# Patient Record
Sex: Male | Born: 1950 | Race: White | Hispanic: No | Marital: Married | Smoking: Former smoker
Health system: Southern US, Community
[De-identification: ages and names within clinical notes are randomized; demographics above are authoritative.]

## PROBLEM LIST (undated history)

## (undated) DIAGNOSIS — I10 Essential (primary) hypertension: Secondary | ICD-10-CM

## (undated) DIAGNOSIS — E785 Hyperlipidemia, unspecified: Secondary | ICD-10-CM

## (undated) DIAGNOSIS — I739 Peripheral vascular disease, unspecified: Secondary | ICD-10-CM

## (undated) DIAGNOSIS — M1990 Unspecified osteoarthritis, unspecified site: Secondary | ICD-10-CM

## (undated) DIAGNOSIS — I6529 Occlusion and stenosis of unspecified carotid artery: Secondary | ICD-10-CM

## (undated) DIAGNOSIS — E1159 Type 2 diabetes mellitus with other circulatory complications: Principal | ICD-10-CM

## (undated) DIAGNOSIS — I639 Cerebral infarction, unspecified: Secondary | ICD-10-CM

## (undated) DIAGNOSIS — I4891 Unspecified atrial fibrillation: Secondary | ICD-10-CM

## (undated) DIAGNOSIS — M069 Rheumatoid arthritis, unspecified: Secondary | ICD-10-CM

## (undated) DIAGNOSIS — I2510 Atherosclerotic heart disease of native coronary artery without angina pectoris: Secondary | ICD-10-CM

## (undated) HISTORY — DX: Essential (primary) hypertension: I10

## (undated) HISTORY — DX: Hyperlipidemia, unspecified: E78.5

## (undated) HISTORY — DX: Rheumatoid arthritis, unspecified: M06.9

## (undated) HISTORY — DX: Unspecified osteoarthritis, unspecified site: M19.90

## (undated) HISTORY — DX: Occlusion and stenosis of unspecified carotid artery: I65.29

## (undated) HISTORY — DX: Cerebral infarction, unspecified: I63.9

## (undated) HISTORY — DX: Type 2 diabetes mellitus with other circulatory complications: E11.59

## (undated) HISTORY — DX: Unspecified atrial fibrillation: I48.91

## (undated) HISTORY — DX: Peripheral vascular disease, unspecified: I73.9

## (undated) HISTORY — PX: ANKLE SURGERY: SHX546

## (undated) HISTORY — PX: HAND SURGERY: SHX662

## (undated) HISTORY — PX: KNEE SURGERY: SHX244

---

## 2001-06-11 ENCOUNTER — Encounter: Payer: Self-pay | Admitting: Emergency Medicine

## 2001-06-11 ENCOUNTER — Ambulatory Visit (HOSPITAL_COMMUNITY): Admission: RE | Admit: 2001-06-11 | Discharge: 2001-06-11 | Payer: Self-pay | Admitting: Emergency Medicine

## 2002-06-03 ENCOUNTER — Encounter: Admission: RE | Admit: 2002-06-03 | Discharge: 2002-06-03 | Payer: Self-pay | Admitting: Emergency Medicine

## 2002-06-03 ENCOUNTER — Encounter: Payer: Self-pay | Admitting: Emergency Medicine

## 2002-06-08 ENCOUNTER — Encounter: Payer: Self-pay | Admitting: Emergency Medicine

## 2002-06-08 ENCOUNTER — Ambulatory Visit (HOSPITAL_COMMUNITY): Admission: RE | Admit: 2002-06-08 | Discharge: 2002-06-08 | Payer: Self-pay | Admitting: Emergency Medicine

## 2002-11-15 ENCOUNTER — Ambulatory Visit (HOSPITAL_BASED_OUTPATIENT_CLINIC_OR_DEPARTMENT_OTHER): Admission: RE | Admit: 2002-11-15 | Discharge: 2002-11-15 | Payer: Self-pay | Admitting: Orthopedic Surgery

## 2003-05-31 ENCOUNTER — Encounter: Admission: RE | Admit: 2003-05-31 | Discharge: 2003-05-31 | Payer: Self-pay | Admitting: Emergency Medicine

## 2003-06-20 ENCOUNTER — Encounter: Admission: RE | Admit: 2003-06-20 | Discharge: 2003-06-20 | Payer: Self-pay | Admitting: Emergency Medicine

## 2003-10-13 ENCOUNTER — Encounter: Admission: RE | Admit: 2003-10-13 | Discharge: 2003-10-13 | Payer: Self-pay | Admitting: Emergency Medicine

## 2003-12-27 ENCOUNTER — Encounter: Admission: RE | Admit: 2003-12-27 | Discharge: 2003-12-27 | Payer: Self-pay | Admitting: Emergency Medicine

## 2004-05-09 ENCOUNTER — Encounter: Admission: RE | Admit: 2004-05-09 | Discharge: 2004-05-09 | Payer: Self-pay | Admitting: Emergency Medicine

## 2004-07-29 DIAGNOSIS — I639 Cerebral infarction, unspecified: Secondary | ICD-10-CM

## 2004-07-29 HISTORY — DX: Cerebral infarction, unspecified: I63.9

## 2004-10-31 ENCOUNTER — Encounter: Admission: RE | Admit: 2004-10-31 | Discharge: 2004-10-31 | Payer: Self-pay | Admitting: Emergency Medicine

## 2004-11-12 ENCOUNTER — Ambulatory Visit (HOSPITAL_COMMUNITY): Admission: RE | Admit: 2004-11-12 | Discharge: 2004-11-12 | Payer: Self-pay | Admitting: Orthopedic Surgery

## 2004-11-12 ENCOUNTER — Ambulatory Visit (HOSPITAL_BASED_OUTPATIENT_CLINIC_OR_DEPARTMENT_OTHER): Admission: RE | Admit: 2004-11-12 | Discharge: 2004-11-12 | Payer: Self-pay | Admitting: Orthopedic Surgery

## 2004-11-16 ENCOUNTER — Emergency Department (HOSPITAL_COMMUNITY): Admission: EM | Admit: 2004-11-16 | Discharge: 2004-11-17 | Payer: Self-pay | Admitting: Emergency Medicine

## 2005-03-25 ENCOUNTER — Encounter: Admission: RE | Admit: 2005-03-25 | Discharge: 2005-03-25 | Payer: Self-pay | Admitting: Emergency Medicine

## 2005-05-06 ENCOUNTER — Encounter: Admission: RE | Admit: 2005-05-06 | Discharge: 2005-05-06 | Payer: Self-pay | Admitting: Emergency Medicine

## 2005-05-10 ENCOUNTER — Encounter (INDEPENDENT_AMBULATORY_CARE_PROVIDER_SITE_OTHER): Payer: Self-pay | Admitting: *Deleted

## 2005-05-10 ENCOUNTER — Ambulatory Visit (HOSPITAL_COMMUNITY): Admission: RE | Admit: 2005-05-10 | Discharge: 2005-05-11 | Payer: Self-pay | Admitting: Vascular Surgery

## 2005-05-10 HISTORY — PX: ANGIOPLASTY: SHX39

## 2005-05-10 HISTORY — PX: CAROTID ENDARTERECTOMY: SUR193

## 2005-06-11 ENCOUNTER — Encounter: Admission: RE | Admit: 2005-06-11 | Discharge: 2005-09-09 | Payer: Self-pay | Admitting: Emergency Medicine

## 2006-03-07 ENCOUNTER — Encounter: Admission: RE | Admit: 2006-03-07 | Discharge: 2006-03-07 | Payer: Self-pay | Admitting: Emergency Medicine

## 2006-09-11 ENCOUNTER — Encounter: Admission: RE | Admit: 2006-09-11 | Discharge: 2006-09-11 | Payer: Self-pay | Admitting: Emergency Medicine

## 2006-12-15 ENCOUNTER — Ambulatory Visit: Payer: Self-pay | Admitting: Vascular Surgery

## 2007-06-12 ENCOUNTER — Ambulatory Visit: Payer: Self-pay | Admitting: Vascular Surgery

## 2007-06-18 ENCOUNTER — Encounter: Payer: Self-pay | Admitting: Vascular Surgery

## 2007-06-18 ENCOUNTER — Observation Stay (HOSPITAL_COMMUNITY): Admission: RE | Admit: 2007-06-18 | Discharge: 2007-06-19 | Payer: Self-pay | Admitting: Vascular Surgery

## 2007-06-18 ENCOUNTER — Ambulatory Visit: Payer: Self-pay | Admitting: Vascular Surgery

## 2007-07-03 ENCOUNTER — Ambulatory Visit: Payer: Self-pay | Admitting: Vascular Surgery

## 2007-07-30 HISTORY — PX: CAROTID ENDARTERECTOMY: SUR193

## 2008-01-08 ENCOUNTER — Ambulatory Visit: Payer: Self-pay | Admitting: Vascular Surgery

## 2008-07-06 ENCOUNTER — Ambulatory Visit: Payer: Self-pay | Admitting: Vascular Surgery

## 2008-10-24 ENCOUNTER — Ambulatory Visit: Payer: Self-pay | Admitting: Vascular Surgery

## 2009-10-13 ENCOUNTER — Ambulatory Visit: Payer: Self-pay | Admitting: Vascular Surgery

## 2010-01-24 ENCOUNTER — Ambulatory Visit: Payer: Self-pay | Admitting: Vascular Surgery

## 2010-02-06 ENCOUNTER — Ambulatory Visit: Payer: Self-pay | Admitting: Surgery

## 2010-02-06 ENCOUNTER — Ambulatory Visit (HOSPITAL_COMMUNITY): Admission: RE | Admit: 2010-02-06 | Discharge: 2010-02-06 | Payer: Self-pay | Admitting: Surgery

## 2010-02-06 HISTORY — PX: CARDIAC CATHETERIZATION: SHX172

## 2010-02-06 HISTORY — PX: CAROTID STENT: SHX1301

## 2010-03-13 ENCOUNTER — Ambulatory Visit: Payer: Self-pay | Admitting: Vascular Surgery

## 2010-08-18 ENCOUNTER — Encounter: Payer: Self-pay | Admitting: Emergency Medicine

## 2010-10-14 LAB — POCT I-STAT, CHEM 8
BUN: 15 mg/dL (ref 6–23)
Calcium, Ion: 1.16 mmol/L (ref 1.12–1.32)
Chloride: 105 mEq/L (ref 96–112)
Creatinine, Ser: 0.7 mg/dL (ref 0.4–1.5)
Glucose, Bld: 133 mg/dL — ABNORMAL HIGH (ref 70–99)
HCT: 40 % (ref 39.0–52.0)
Hemoglobin: 13.6 g/dL (ref 13.0–17.0)
Potassium: 4 mEq/L (ref 3.5–5.1)
Sodium: 142 mEq/L (ref 135–145)
TCO2: 25 mmol/L (ref 0–100)

## 2010-10-14 LAB — GLUCOSE, CAPILLARY
Glucose-Capillary: 140 mg/dL — ABNORMAL HIGH (ref 70–99)
Glucose-Capillary: 163 mg/dL — ABNORMAL HIGH (ref 70–99)

## 2010-12-11 NOTE — Assessment & Plan Note (Signed)
OFFICE VISIT   Colin Parks, Colin Parks  DOB:  [birth year]                                       03/13/2010  [id number]#:[id number]   Patient presents today for follow-up of his recent right subclavian  angioplasty and stenting by Dr. Myra Gianotti on 02/06/2010.  He has done well  since the procedure and reports that he has had a significant  improvement in his dizziness.  He has had 1 episode of dizziness on  looking up over his head with his arms raised but otherwise no  dizziness.   His main disability before had been with driving.  He reports no  dizziness related to this.   PHYSICAL EXAMINATION:  He does have 2+ radial pulses bilaterally.  His  blood pressure is 150 systolic on the right, 146 systolic on the left.  He has triphasic waveforms throughout his axillary and brachial artery  on the right.   I have reviewed the actual films with patient, answering his questions  regarding the technique of angioplasty.  He did have the initial stent  migrated forward and was deployed in the more distal subclavian artery  and then treatment of the focal lesion with a second stent.  He has had  an excellent early response.  We will plan on seeing him again in 1 year  with repeat carotid duplex.     Larina Earthly, M.D.  Electronically Signed   TFE/MEDQ  D:  03/13/2010  T:  03/13/2010  Job:  [id number]   cc:   Jorge Ny, MD

## 2010-12-11 NOTE — Procedures (Signed)
CAROTID DUPLEX EXAM   INDICATION:  Followup bilateral carotid endarterectomies.   HISTORY:  Diabetes:  Yes.  Cardiac:  No.  Hypertension:  Yes.  Smoking:  No.  Previous Surgery:  Right carotid endarterectomy on 06/18/2007, left  carotid endarterectomy in 2006.  CV History:  No.  Amaurosis Fugax No, Paresthesias No, Hemiparesis No                                       RIGHT             LEFT  Brachial systolic pressure:         146               148  Brachial Doppler waveforms:         Normal            Normal  Vertebral direction of flow:        Antegrade         Antegrade  DUPLEX VELOCITIES (cm/sec)  CCA peak systolic                   93                78  ECA peak systolic                   338               130  ICA peak systolic                   57                89  ICA end diastolic                   14                16  PLAQUE MORPHOLOGY:                  Heterogenous      None  PLAQUE AMOUNT:                      Moderate          None  PLAQUE LOCATION:                    ECA               None   IMPRESSION:  1. Patent bilateral carotid endarterectomy sites with no evidence of      stenosis.  2. Intimal thickening noted in the bilateral distal common carotid      arteries.  3. Elevated velocity of 316 cm/second noted in the right subclavian      artery which is suggestive of a significant stenosis.   ___________________________________________  Larina Earthly, M.D.   CH/MEDQ  D:  01/08/2008  T:  01/08/2008  Job:  [id number]

## 2010-12-11 NOTE — Procedures (Signed)
CAROTID DUPLEX EXAM   INDICATION:  Follow-up evaluation of known carotid artery disease.   HISTORY:  Diabetes:  Yes.  Cardiac:  No.  Hypertension:  Yes.  Smoking:  No.  Previous Surgery:  Right carotid endarterectomy on 06/18/2007.  Left  carotid endarterectomy in 2006.  CV History:  Patient reports no cerebrovascular symptoms at this time.  Previous duplex on 01/08/2008 revealed bilateral ICA stenosis, status  post endarterectomy.  Amaurosis Fugax No, Paresthesias No, Hemiparesis No.                                       RIGHT             LEFT  Brachial systolic pressure:         126               168  Brachial Doppler waveforms:         Monophasic        Triphasic  Vertebral direction of flow:        Atypical antegrade                  Antegrade  DUPLEX VELOCITIES (cm/sec)  CCA peak systolic                   86                120  ECA peak systolic                   302               116  ICA peak systolic                   57                69  ICA end diastolic                   21                20  PLAQUE MORPHOLOGY:                  Mixed             None  PLAQUE AMOUNT:                      Moderate-to-severe                  None  PLAQUE LOCATION:                    Proximal subclavian artery          None   IMPRESSION:  1. Right subclavian peak systolic velocity 305 cm/s.  2. No internal carotid artery stenosis, status post endarterectomy      bilaterally.  3. Right external carotid artery stenosis.  4. 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   ___________________________________________  Larina Earthly, M.D.   MC/MEDQ  D:  07/06/2008  T:  07/06/2008  Job:  [id number]

## 2010-12-11 NOTE — Procedures (Signed)
CAROTID DUPLEX EXAM   INDICATION:  Dizziness.   HISTORY:  Diabetes:  Yes.  Cardiac:  No.  Hypertension:  Yes.  Smoking:  No.  Previous Surgery:  Left carotid endarterectomy in 2006, right carotid  endarterectomy on 06/18/07.  CV History:  Yes, in 2006.  Amaurosis Fugax No, Paresthesias No, Hemiparesis No.                                       RIGHT             LEFT  Brachial systolic pressure:         106               162  Brachial Doppler waveforms:         Monophasic        Triphasic  Vertebral direction of flow:        Atypical antegrade                  Antegrade  DUPLEX VELOCITIES (cm/sec)  CCA peak systolic                   129               138  ECA peak systolic                   383               172  ICA peak systolic                   85                75  ICA end diastolic                   36                31  PLAQUE MORPHOLOGY:                  None              None  PLAQUE AMOUNT:                      None              None  PLAQUE LOCATION:                    None              None   IMPRESSION:  1. Patent bilateral internal carotid arteries with no evidence of      restenosis.  2. A known increased velocity in the right subclavian artery was also      noted.    ___________________________________________  Larina Earthly, M.D.   AC/MEDQ  D:  10/24/2008  T:  10/24/2008  Job:  [id number]

## 2010-12-11 NOTE — Procedures (Signed)
CAROTID DUPLEX EXAM   INDICATION:  Follow up of known carotid artery disease.  The patient is  experiencing floaters in left eye.  He has an appointment with his eye  doctor on 07/08/2007.   HISTORY:  Diabetes:  Yes.  Cardiac:  No.  Hypertension:  Yes.  Smoking:  No.  Previous Surgery:  Left CEA in 2006.  CV History:  Amaurosis Fugax no, Paresthesias no, Hemiparesis no.                                       RIGHT             LEFT  Brachial systolic pressure:         145               120  Brachial Doppler waveforms:         Within normal limits                Within normal limits  Vertebral direction of flow:        antegrade         antegrade  DUPLEX VELOCITIES (cm/sec)  CCA peak systolic                   73                112  ECA peak systolic                   199               104  ICA peak systolic                   >400              92  ICA end diastolic                   207               24  PLAQUE MORPHOLOGY:                  heterogenous      N/A  PLAQUE AMOUNT:                      severe            N/A  PLAQUE LOCATION:                    Bifurcation, ICA. N/A   IMPRESSION:  1. Right 80 to 99 ICA stenosis (at higher end of range), which would  demonstrate progress in disease since previous exam.  2. No recurrent stenosis, status post left CEA.  3. Right ECA with mild stenosis.  4. Evidence of right SCA occlusive disease with biphasic waveforms,  however, vertebral direction of  flow remains antegrade.   ___________________________________________  Larina Earthly, M.D.   PB/MEDQ  D:  06/12/2007  T:  06/13/2007  Job:  [id number]

## 2010-12-11 NOTE — Op Note (Signed)
NAME:  Colin Parks, Colin Parks NO.:  [id number]   MEDICAL RECORD NO.:  [id number]          PATIENT TYPE:  INP   LOCATION:  2899                         FACILITY:  [facility]   PHYSICIAN:  Larina Earthly, M.D.    DATE OF BIRTH:  1950-10-11   DATE OF PROCEDURE:  06/18/2007  DATE OF DISCHARGE:                               OPERATIVE REPORT   PREOPERATIVE DIAGNOSIS:  Severe asymptomatic right internal carotid  stenosis.   POSTOPERATIVE DIAGNOSIS:  Severe asymptomatic right internal carotid  stenosis.   PROCEDURE:  Right carotid endarterectomy and Dacron patch angioplasty.   SURGEON:  Larina Earthly, M.D.   ASSISTANT:  Jerold Coombe, P.A.-C.   ANESTHESIA:  General endotracheal anesthesia.   COMPLICATIONS:  None.   DISPOSITION:  To the recovery room stable.   PROCEDURE IN DETAIL:  The patient was taken to the operating room and  placed in the supine position where the area of the right neck was  prepped and draped in the usual sterile fashion.  An incision was made  anterior to the sternocleidomastoid and carried down through the  platysma with electrocautery.  The sternocleidomastoid was reflected  posteriorly and the carotid sheath was opened.  The facial vein was  ligated with 2-0 silk ties and divided.  The sternocleidomastoid was  reflected posteriorly and the carotid sheath was opened.  The vagus and  hypoglossal nerves were identified and preserved.  The common carotid  artery was circled with an umbilical tape and Rumel tourniquet.  The  dissection was carried onto the bifurcation. The external carotid was  encircled with a blue vessel loop.  The internal carotid was encircled  with an umbilical tape and Rumel tourniquet. The superior thyroid artery  was encircled with a 2-0 silk Potts tie.  The patient was given 8000  units of intravenous heparin. After adequate circulation time, the  internal, external, and common carotid arteries were occluded.  The  common  carotid artery was opened with an 11 blade and extended  longitudinally with Potts scissors through the plaque onto the internal  carotid artery.  The 10 shunt was passed up the internal carotid artery,  allowed to back bleed, and down the common carotid where it was secured  with Rumel tourniquets.  An endarterectomy was begun on the common  carotid artery and the plaque was divided proximally with Potts  scissors.  The endarterectomy was continued onto the bifurcation.  The  external carotid was endarterectomized with eversion technique and the  internal carotid was endarterectomized in an open fashion.  The  remaining atheromatous debris was removed from the endarterectomy plane.  A Finesse Hemashield Dacron patch was brought onto the field and was  sewn as a patch angioplasty with a running 6-0 Prolene suture.  Prior to  completion of the patch closure, the shunt was removed and the usual  flushing maneuvers were undertaken.  The anastomosis was completed.  Flow was restored first to the external and then the internal carotid  arteries.  Excellent flow characteristics were noted with handheld  Doppler in the internal and  external carotid arteries.  The patient was  given 50 mg of protamine to reverse the heparin.  The wounds were  irrigated with saline.  Hemostasis was with electrocautery.  The wounds  were closed with several 3-0 Vicryl sutures to reapproximate the  sternocleidomastoid over the carotid sheath.  Next, the platysma was  closed with a running 3-0 Vicryl suture and finally, the skin was closed  with a 4-0 subcuticular Vicryl stitch.  A sterile dressing was applied.  The patient was awakened neurologically intact in the operating room and  transferred to the recovery room in stable condition.      Larina Earthly, M.D.  Electronically Signed     TFE/MEDQ  D:  06/18/2007  T:  06/18/2007  Job:  [id number]

## 2010-12-11 NOTE — H&P (Signed)
HISTORY AND PHYSICAL EXAMINATION   June 12, 2007   Re:  HARTMAN, MINAHAN                DOB:  May 29, 1951   CHIEF COMPLAINT:  Asymptomatic right internal carotid artery stenosis.   HISTORY OF PRESENT ILLNESS:  Patient is a 60 year old white male status  post left carotid endarterectomy for symptomatic left carotid stenosis  in 2006.  He had a known moderate right internal carotid artery stenosis  and has undergone serial duplex followups to rule out any progression.  He presents today for followup of his asymptomatic disease.  He  specifically denies any new episodes of amaurosis fugax, transient  ischemic attack, or stroke.  He has been stable from an overall medical  standpoint with no new cardiac difficulty or other major medical  difficulties.   PAST MEDICAL HISTORY:  Significant for non-insulin-dependent diabetes  and hypertension.   FAMILY HISTORY:  Significant for premature atherosclerotic disease in  his mother.   SOCIAL HISTORY:  Married with two children.  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  Does  not smoke and has occasional alcohol consumption.   REVIEW OF SYSTEMS:  Positive only for his neurologic deficits and also a  history of arthritis and joint pain.   ALLERGIES:  CODEINE.   MEDICATIONS:  1. Metformin 500 mg daily.  2. Altace 5 mg daily.  3. Vytorin daily.  4. Singulair daily.  5. He is on aspirin and Plavix.   PHYSICAL EXAMINATION:  A well-developed and well-nourished white male  appearing stated age of 60.  Blood pressure is 142/93, pulse 78,  respirations 18.  He is 98% saturated on room air.  His carotid arteries  reveals a well-healed left carotid incision.  He does have a right  carotid bruit.  He underwent a repeat carotid duplex in our office  today, and this reveals progression to critical stenosis in his right  internal carotid system with no evidence of restenosis in his left  carotid artery.   IMPRESSION:  Severe asymptomatic right  internal carotid artery stenosis.   PLAN:  Patient will be admitted for right carotid endarterectomy and  Dacron patch angioplasty on November 20.  The procedure, including a 1-  10% risk of stroke and low risk for cranial nerve injury were discussed  with the patient, who understands.  Also discussed the alternative of  carotid stenting and recommended against this to his 60 age and  higher stroke rate with stenting.   Larina Earthly, M.D.  Electronically Signed   TFE/MEDQ  D:  06/12/2007  T:  06/15/2007  Job:  [id number]   cc:   Reuben Likes, M.D.

## 2010-12-11 NOTE — Assessment & Plan Note (Signed)
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  He is status post right carotid  endarterectomy 6 months ago and status post left carotid endarterectomy  in 2006.  He has had no neurologic deficits.  He does report some  incisional soreness in the right mid carotid incision.  He reports it  causes some discomfort when he shaves.  He has had no new cardiac  difficulties.  He continues to be a nonsmoker.  He is treated for  diabetes and hypertension.   PHYSICAL EXAMINATION:  General:  A well-developed, well-nourished white  male appearing his stated age of 45.  Vital signs:  Blood pressure is  154/99 on the right, 144/90 on the left, pulse 99, respirations 18.  He  has 2+ left radial pulse, 1+ right radial pulse.  Carotid incisions are  well-healed bilaterally.  He does not have any carotid bruits  bilaterally.  He is grossly intact neurologically.   He underwent carotid duplex followup today in our office and this  reveals a widely patent endarterectomy bilaterally with no evidence of  stenosis.  He does have elevated velocities in his external carotid on  the right which is of no consequence.  He also had an incidental finding  of right subclavian artery stenosis which is asymptomatic.  I discussed  this at length with the patient.  We will continue to follow him in our  noninvasive vascular lab to rule out any progression in the future.  He  knows to notify us should he develop any neurologic deficits.   Larina Earthly, M.D.  Electronically Signed   TFE/MEDQ  D:  01/08/2008  T:  01/08/2008  Job:  [id number]   cc:   Reuben Likes, M.D.

## 2010-12-11 NOTE — Discharge Summary (Signed)
NAME:  Colin Parks, Colin Parks NO.:  [id number]   MEDICAL RECORD NO.:  [id number]          PATIENT TYPE:  INP   LOCATION:  3310                         FACILITY:  [facility]   PHYSICIAN:  Larina Earthly, M.D.    DATE OF BIRTH:  [birth year]   DATE OF ADMISSION:  06/18/2007  DATE OF DISCHARGE:  06/19/2007                               DISCHARGE SUMMARY   ADMISSION DIAGNOSIS:  Severe right internal carotid stenosis,  asymptomatic.   DIAGNOSES AT DISCHARGE AND SECONDARY:  1. Asymptomatic right internal carotid stenosis.  2. Non-insulin-dependent diabetes.  3. Hypertension.   CONSULTS:  None.   PROCEDURES:  Right carotid endarterectomy with Dacron patch.   BRIEF HISTORY:  The patient is a 60 year old white male status post left  carotid endarterectomy for symptomatic left carotid stenosis in 2006.  He had known moderate right internal carotid artery stenosis and has  undergone serial duplex followups to rule out any progression.  He  presented on June 12, 2007 in the office for a followup of an  asymptomatic disease.  He specifically denied any new episodes of  amaurosis fugax, transient ischemic attack, or stroke.  The patient has  been stable from an overall medical standpoint with no new cardiac  difficulty or other major medical difficulties.   On physical examination in the office, the patient was a well-developed  and well-nourished white male appearing stated age of 60.  His vitals  were stable, and he was afebrile.  The patient had a right carotid  bruit.   The patient underwent a right carotid duplex in the office on June 12, 2007 and revealed progression to critical stenosis in his right  internal carotid system with no evidence of restenosis in his left  carotid artery.   Upon impression of a severe asymptomatic right internal carotid arterial  stenosis, plans were made for the patient to be admitted for a right  carotid endarterectomy and Dacron patch  angioplasty on June 18, 2007.  This procedure included a 1% to 10% risk of stroke and low risk  for cranial nerve injury that were discussed with the patient, and the  patient understood.  Also discussed with patient was alternative carotid  stenting, even though we recommended against this due to his young age  and higher stroke rate with stenting.  The patient agreed to have the  procedure of the right carotid endarterectomy with Dacron patch on  June 18, 2007.  Procedure was completed with no complications.   HOSPITAL COURSE:  Procedure for a right carotid endarterectomy with  Dacron patch was completed on June 18, 2007 by Dr. Arbie Cookey.  After  completion of procedure, the patient was in the PACU unit in good  condition.  Later, the patient was transferred to 3300 where patient  continued to remain stable.  On June 19, 2007, postop day #1,  patient was afebrile.  The patient's blood pressure is 132/59, heart  rate 71, temperature 99.9, and O2 at 96.   LABS:  White blood count 11.8, hemoglobin 11.9, hematocrit 34.9,  platelets 271, red blood  cell 4.16.  Sodium 139, potassium 3.7, chloride  104, bicarb 26, glucose 137, BUN 11, creatinine 0.85, calcium 8.5.   On physical examination, the patient is alert and oriented with no  complaints.  The patient's incision looks good.  No hematoma.  The  patient is eating and tolerating food well.  Plan is for patient to go  home once he is ambulated in the morning.  The patient is neurologically  intact and remains stable at this time.  There is no signs of deviation  of the tongue.  At this time, we feel as long as the patient is able to  ambulate in the morning and continues to tolerate and swallow food  without any difficulty, will be prepared to discharge to home today.   DISCHARGE PLANS:   MEDICATIONS:  1. Metformin 500 mg a day.  2. Altace 5 mg a day.  3. Vytorin daily.  4. Singulair daily.  5. Tylenol.  6. Aspirin 81  mg.  7. __________ .  8. Multivitamin.  9. Meloxicam.  10.Plavix 75 mg.  11.Darvocet N-100 one to two tablets every 6 hours as needed for pain.   FOLLOWUP CARE AND INSTRUCTIONS FOR PATIENT UPON DISCHARGING:  The  patient was instructed to continue to maintain a low-sodium heart  healthy diet along with monitoring his diabetic and eating accordingly  for diabetic lifestyle.  The patient was instructed that he could  increase activity slowly, may walk with assistance, walk up steps.  The  patient was also instructed may shower and bathe starting on June 20, 2007.  The patient is also instructed for no lifting, no driving for  the next 2 weeks.  The patient was instructed to call for any redness,  drainage from incision, or a fever greater than 101.  The patient was  also instructed if he had any severe headaches or new weakness, he was  also to contact the office or ER.  The patient was instructed to clean  incisions gently with soap and water.  The patient is instructed to  return to Dr. Arbie Cookey in 2 weeks for a followup.  Also instructed the  patient to resume medications before being admitted on June 18, 2007.  The patient was also given Darvocet N-100 one to two tablets  every 6 hours as needed for pain.  The patient agreed to these  instructions and is ready to be discharged home on June 19, 2007.      Cyndy Freeze, PA      Larina Earthly, M.D.  Electronically Signed    ALW/MEDQ  D:  06/19/2007  T:  06/19/2007  Job:  [id number]

## 2010-12-11 NOTE — Assessment & Plan Note (Signed)
OFFICE VISIT   Colin Parks, Colin Parks  DOB:  [birth year]                                       01/24/2010  QIONG#:[id number]   The patient presents today for discussion of extracranial  cerebrovascular occlusive disease.  He is well-known to me from prior  carotid endarterectomies.  He initially underwent left carotid  endarterectomy for symptomatic disease in 2006 and a right carotid  endarterectomy in 2009 for asymptomatic disease.  He has done well and  has had no new recurrent problems.  Recently he has had progressive  changes of dizziness.  He reports this happens approximately five times  per week and occasionally has to lean against a wall to prevent falling.  This occurs occasionally with driving, and also he reports this can  occasionally occur with exercise of the right arm.  He does have some  orthostatic nature of this as well with some worsening when he initially  stands.  His health is otherwise stable.  He does have non-insulin  diabetes, elevated cholesterol, and elevated blood pressure and did have  a stroke prior to his initial surgery.   SOCIAL HISTORY:  He is married.  He does not smoke having quit in 1997.  Does not drink alcohol, does have family history of significant  premature atherosclerotic disease in his sister.   REVIEW OF SYSTEMS:  As reviewed in his chart is mainly due to  musculoskeletal and joint pain.   PHYSICAL EXAM.:  A well-developed well-nourished white male appearing  stated age of 18.  Blood pressure is 106/64 in his right arm, 154/97 in  his left arm, heart rate 96, respirations 18.  He is in no acute  distress.  HEENT:  Normal, and he does have bilateral carotid incisions which are  well-healed without bruits.  He has absent right radial pulse and has a  2+ left radial pulse.  He is grossly intact neurologically.   I discussed options with the patient and reviewed his prior duplex of  his carotids.  He has had a known  right carotid stenosis or occlusion  for many years.  I explained to him that this very well may be causing  his dizziness since he has had abnormal flow in his right vertebral  artery in these serial studies.  I have recommend that we proceed with  arteriography and possible stenting of his right subclavian if his  anatomy is amenable to this.  He understands the procedure and risks and  he has been scheduled with Dr. Jorge Ny, MD on June 12 at  [facility].  He also understands that he may have occlusion or  stenosis that is not amenable to angioplasty at which time we will  discuss observation versus surgical repair.     Larina Earthly, M.D.  Electronically Signed   TFE/MEDQ  D:  01/24/2010  T:  01/25/2010  Job:  [id number]   cc:   Reuben Likes, M.D.

## 2010-12-11 NOTE — Procedures (Signed)
VASCULAR LAB EXAM   INDICATION:  Follow-up right subclavian artery stent.   HISTORY:  Diabetes:  yes  Cardiac:  no  Hypertension:  yes   EXAM:  Eval right subclavian artery stent.   IMPRESSION:  Triphasic wave forms noted throughout, common carotid  artery, subclavian artery, axillary artery, and brachial artery with no  evidence of restenosis.  The right arm blood pressure 150, left arm  blood pressure 146.   ___________________________________________  Larina Earthly, M.D.   NT/MEDQ  D:  03/13/2010  T:  03/13/2010  Job:  [id number]

## 2010-12-11 NOTE — Procedures (Signed)
CAROTID DUPLEX EXAM   INDICATION:  Followup carotid artery disease, right subclavian artery  stenosis with mildly increased symptoms per the patient.   HISTORY:  Diabetes:  Yes.  Cardiac:  No.  Hypertension:  Yes.  Smoking:  No.  Previous Surgery:  Left CEA 2006, right CEA 2008.  CV History:  Yes 2006.  Amaurosis Fugax No, Paresthesias No, Hemiparesis No                                       RIGHT             LEFT  Brachial systolic pressure:         98                164  Brachial Doppler waveforms:         Monophasic        WNL  Vertebral direction of flow:        To and fro        Antegrade  DUPLEX VELOCITIES (cm/sec)  CCA peak systolic                   77                76  ECA peak systolic                   210               98  ICA peak systolic                   57                74  ICA end diastolic                   12                20  PLAQUE MORPHOLOGY:  PLAQUE AMOUNT:                      None in ICA       None in ICA  PLAQUE LOCATION:   IMPRESSION:  1. Bilateral internal carotid arteries appear within normal limits      status post carotid endarterectomies.  2. Right external carotid artery stenosis.  3. Right vertebral artery flow now appears to and fro, left is      antegrade.  4. Right subclavian artery velocities greater than 241 cm/s (unable to      obtain maximum velocity due to technical limitations).   ___________________________________________  Larina Earthly, M.D.   AS/MEDQ  D:  10/13/2009  T:  10/13/2009  Job:  [id number]

## 2010-12-11 NOTE — Assessment & Plan Note (Signed)
OFFICE VISIT   [name], Colin Parks  DOB:  02-Feb-1951                                       07/03/2007  VWUJW#:[id number]   The patient presents today for followup of his right carotid  endarterectomy for spurious symptomatic disease on 11/20.  He did well  and was discharged on postoperative day 1.  He does report the usual  amount of peri-incisional discomfort and numbness, but has had no  neurologic deficits and no wound issues.  His neck incision is well-  healed.  He is neurologically intact.  He does have elevated blood  pressure, initially 182/114, recheck was down to 160/100.  Heart rate  107.  He will continue to follow up with Dr. Lorenz Coaster with this.  I will  see him again in 6 months with repeat carotid duplex.  He will notify  should he develop any deficits in the interim.   Larina Earthly, M.D.  Electronically Signed   TFE/MEDQ  D:  07/03/2007  T:  07/06/2007  Job:  [id number]   cc:   Reuben Likes, M.D.

## 2010-12-14 NOTE — Consult Note (Signed)
NAME:  COLTRANE, TUGWELL NO.:  [id number]   MEDICAL RECORD NO.:  [id number]          PATIENT TYPE:  EMS   LOCATION:  MAJO                         FACILITY:  [facility]   PHYSICIAN:  Doralee Albino. Carola Frost, M.D. DATE OF BIRTH:  [birth year]   DATE OF CONSULTATION:  11/16/2004  DATE OF DISCHARGE:  11/17/2004                                   CONSULTATION   REASON FOR CONSULTATION:  Postoperative left knee pain, increasing with  associated increasing erythema.   BRIEF HISTORY OF PRESENTATION:  Mr. Moncus is a 60 year old white male who  underwent partial synovectomy and chondroplasty by Dr. Thurston Hole on November 12, 2004.  He had been doing well for the first 2 days, but over the last 2  days, experienced some increasing swelling and persistent erythema around  the medial and superior aspect of his patella.  He became concerned about  the possibility of infection and subsequently presented to the emergency  room.  He denied fever over 100 degrees and denied malaise.  Also, he denies  sensory or motor complaints in that extremity.   PAST MEDICAL HISTORY:  Notable for non-insulin-dependent diabetes and  hypertension.   PAST SURGICAL HISTORY:  Prior left knee scope as discussed above, as well as  prior ankle scope also by Dr. Thurston Hole.   MEDICATIONS:  1.  Altace.  2.  Glucophage.   ALLERGIES:  NO KNOWN DRUG ALLERGIES.   SOCIAL HISTORY:  No alcohol, no tobacco.  Patient presents with his wife.   FAMILY HISTORY:  Noncontributory.   REVIEW OF SYSTEMS:  Noncontributory. The patient has no history of recent  nausea or vomiting, or difficulty with urination postoperatively.   PHYSICAL EXAMINATION:  Mr. Palos does not appear in acute distress.  He holds  his knee in a slightly flexed posture.  He does have a visible effusion.  There is redness which is not well-demarcated and which, for whatever  reason, appears more prominent, or more visible on the medial side.  He has  intact deep  peroneal, superficial peroneal and tibial nerve sensorimotor  function. Dorsalis pedis and posterior tibial pulses are easily palpable.  He is using a cane to assist with ambulation.  He has no other focal  symptoms.   LABORATORY DATA:  Notable for a white blood cell count of 11, hematocrit 43,  neutrophils show 65%, consequently, there is no left shift.  Chemistries  notable for creatinine 1.0.  All of his other examinations were within  normal limitations.   He did again, undergo aspiration of the left knee and the findings there  demonstrated no organisms, with 14,600 white cells and no crystals as well.   ASSESSMENT:  Inflammation of the left knee status post partial synovectomy.   PLAN:  Given Mr. Trefry clinical picture and laboratory studies, this is  consistent with postoperative inflammation of the knee, and is not  diagnostic of infection.  We certainly will follow his cultures closely,  particularly given his history of diabetes, and we will go ahead and  administer antibiotics, just in case he should develop a positive  culture.   He will keep his regularly scheduled appointment with Dr. Thurston Hole, to be  evaluated on Monday.  Again, he has hard contact information and will notify  us immediately should he develop worsening symptoms.      MHH/MEDQ  D:  11/17/2004  T:  11/18/2004  Job:  [id number]

## 2010-12-14 NOTE — Op Note (Signed)
NAME:  Colin Parks, Colin Parks                          ACCOUNT NO.:  [id number]   MEDICAL RECORD NO.:  [id number]                   PATIENT TYPE:  AMB   LOCATION:  DSC                                  FACILITY:  [facility]   PHYSICIAN:  Robert A. Thurston Parks, M.D.              DATE OF BIRTH:  July 03, 1951   DATE OF PROCEDURE:  11/15/2002  DATE OF DISCHARGE:                                 OPERATIVE REPORT   PREOPERATIVE DIAGNOSES:  1. Left ankle synovitis.  2. Left ankle peroneus brevis partial tear.   POSTOPERATIVE DIAGNOSES:  1. Left ankle synovitis.  2. Left ankle peroneus brevis partial tear.   PROCEDURE:  1. Left ankle EUA followed by arthroscopic partial synovectomy.  2. Left ankle peroneus brevis repair.   SURGEON:  Colin Parks, M.D.   ASSISTANT:  Julien Girt, P.A.   ANESTHESIA:  General.   OPERATIVE TIME:  45 minutes.   COMPLICATIONS:  None.   INDICATIONS FOR PROCEDURE:  The patient is a 60 year old gentleman who has  had pain in his left ankle for the last six months secondary to a  significant sprain.  Exam, x-rays, and MRI have shown synovitis and a split  tear of the peroneus brevis that has not healed, and thus he is to undergo  arthroscopy and repair.   DESCRIPTION OF PROCEDURE:  The patient was brought to the operating room on  November 15, 2002 and placed on the operative table in a supine position.  He  did have a small amount of poison ivy with only one lesion on the upper  aspect of his shin and no lesions around the ankle, and a few lesions on the  other leg, but I did not feel this was increased risk of infection.  I did  give him Ancef 1 gram IV preoperatively for prophylaxis.  After being placed  under general anesthesia his left ankle was examined under anesthesia.  He  had full range of motion and his ankle was stable to ligamentous exam.  After this was done then the left foot and leg were prepped using sterile  Duraprep and draped using sterile  technique.  The leg was exsanguinated and  a calf tourniquet elevated to 300 mm.  Initially the arthroscopy was  performed.  Through an anterior medial portal incising only the skin and  carefully dissecting down to the joint to prevent any injury to the dorsal  cutaneous nerves, the arthroscope with a pump attached was placed, and  through an anterior lateral portal again incising only the skin and  carefully dissecting down to the joint, the arthroscopic probe was placed.  On initial inspection the articular cartilage on the talus and tibial  plafond showed grade 2 and 20% grade 3 chondromalacia which was debrided.  Significant synovitis anterolateral and lateral gutter was thoroughly  debrided.  After this was done the articular cartilage on the lateral  malleolus and lateral talus showed grade 1 and 2 chondromalacia as well; no  loose bodies were noted.  Medially, significant synovitis anteromedial and  medial gutter was also thoroughly debrided.  The medial malleolus and  articular portion of the medial talus with this showed grade 1 and 2  articular cartilage chondromalacia and this was debrided.  The rest of the  articular surface was intact.  After this was done no further pathology was  noted.  The arthroscopy instruments were removed.   At this point a 5 cm longitudinal incision was made along the length of the  peroneus brevis and longus tendon to just distal to the fibula.  The  underlying subcutaneous tissues were incised in line with the skin incision.  The tendon sheath was opened and the split tear in the peroneus brevis was  identified.  The general nerve was carefully protected while this was done.  The tendon was then repaired primarily using 2-0 Vicryl suture with multiple  sutures placed in this tendon, thus repairing it back in place.  After this  was done then the wound was irrigated and the tendon sheath was closed with  2-0 Vicryl suture as well, subcutaneous  tissues closed with 2-0 Vicryl, skin  closed with skin staples, the wound injected with 0.25% Marcaine.  Sterile  dressings and a long-leg splint were applied and then the tourniquet was  released and the patient awakened and taken to the recovery room ins stable  condition.   FOLLOW-UP CARE:  The patient will be followed as an outpatient on Percocet  for pain and Naprosyn.  See him back in the office in a week for wound check  and follow-up.                                               Robert A. Thurston Parks, M.D.    RAW/MEDQ  D:  11/15/2002  T:  11/15/2002  Job:  [id number]

## 2010-12-14 NOTE — H&P (Signed)
NAME:  Colin Parks, Colin Parks NO.:  [id number]   MEDICAL RECORD NO.:  [id number]          PATIENT TYPE:  AMB   LOCATION:  SDS                          FACILITY:  [facility]   PHYSICIAN:  Larina Earthly, M.D.    DATE OF BIRTH:  1950-10-21   DATE OF ADMISSION:  05/10/2005  DATE OF DISCHARGE:                                HISTORY & PHYSICAL   CHIEF COMPLAINT:  Symptomatic left carotid stenosis.   HISTORY OF PRESENT ILLNESS:  The patient is an otherwise active 60 year old  white male who 10 days ago had an episode of falling. Since that time he has  had some slurred speech and also some weakness in his right side. This has  had some improvement and then regression, and then improvement again. He has  undergone a workup to include MRI and MRA on May 06, 2005. This revealed  a critical stenosis of his left internal carotid artery and moderate  stenosis of his right carotid artery. MRI also showed evidence of acute  frontal and parietal infarct. His past history is negative for prior  neurologic events. He is a non-insulin-dependent diabetic, does have a  history of hypertension. No history of prior cardiac disease, no elevated  cholesterol, no history of congestive heart failure.   FAMILY HISTORY:  Significant for premature atherosclerotic disease in his  mother.   SOCIAL HISTORY:  Significant for being married with two children. 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 Does not smoke and has occasional alcohol consumption.   REVIEW OF SYSTEMS:  Positive only for his recent neurologic deficits. He  does have a history of arthritis and joint pain.   ALLERGIES:  CODEINE.   MEDICATIONS:  Hydrochlorothiazide, Altace, metformin, and recent Plavix and  aspirin.   PHYSICAL EXAMINATION:  GENERAL:  Well-developed, well-nourished white male  appearing stated age of 19. He does have obvious weakness in his right leg  with walking but is able to walk independently. He has also decreased grip  strength on the right. He does also have some word-searching.  VITAL SIGNS:  Blood pressure today is 130/88, pulse 100, respirations 18.  VASCULAR:  His carotid arteries are without bruits bilaterally. He has 2+  radial pulses, 2+ dorsalis pedis pulses.  HEART:  Regular rate and rhythm.  CHEST:  Clear.   IMPRESSION:  Symptomatic severe carotid disease.   PLAN:  The patient will be admitted to [facility] on May 10, 2005 for left carotid endarterectomy. The procedure and risks including  perioperative stroke were discussed with the patient in detail.      Larina Earthly, M.D.  Electronically Signed     TFE/MEDQ  D:  05/09/2005  T:  05/10/2005  Job:  [id number]   cc:   Reuben Likes, M.D.  Fax: [phone]

## 2010-12-14 NOTE — Op Note (Signed)
NAME:  Colin Parks, Colin Parks NO.:  [id number]   MEDICAL RECORD NO.:  [id number]          PATIENT TYPE:  OIB   LOCATION:  3303                         FACILITY:  [facility]   PHYSICIAN:  Larina Earthly, M.D.    DATE OF BIRTH:  1951-04-07   DATE OF PROCEDURE:  05/10/2005  DATE OF DISCHARGE:                                 OPERATIVE REPORT   PREOPERATIVE DIAGNOSIS:  Symptomatic severe left internal carotid artery  stenosis.   POSTOPERATIVE DIAGNOSIS:  Symptomatic severe left internal carotid artery  stenosis.   PROCEDURE:  Left carotid endarterectomy and Dacron patch angioplasty.   SURGEON:  Larina Earthly, M.D.   ASSISTANT:  Coral Ceo, PA-C.   ANESTHESIA:  General endotracheal.   COMPLICATIONS:  None.   DISPOSITION:  To recovery room, stable, neurologically unchanged from  preoperative.   PROCEDURE IN DETAIL:  The patient was taken to the operating room and placed  in supine position where the area of the left neck was prepped and draped in  the usual sterile fashion.  An incision was made in the anterior  sternocleidomastoid and carried down through the platysma with  electrocautery.  The sternocleidomastoid was reflected posteriorly and the  carotid sheath was opened.  The facial vein was ligated with 2-0 silk ties  and divided.  The patient had a significant amount of inflammation around  the artery with adherence of the surrounding tissue.  The common carotid  artery was encircled with an umbilical tape and Rumel tourniquet.  Dissection was extended onto the bifurcation.  The internal carotid artery  was encircled with an umbilical tape and Rumel tourniquet.  The external  carotid was encircled with a blue Vesseloop.  The superior thyroid artery  was encircled with a 2-0 silk Potts tie. The vagus and hypoglossal nerves  were identified and preserved.  The patient was given 8000 units of  intravenous heparin and after adequate circulation time, the internal  and  external and common carotid arteries were occluded.  The common carotid  artery was opened with an 11 blade and extended longitudinally with Potts  scissors through the plaque onto the internal carotid artery.  The patient  had obvious fresh intraplaque hemorrhage present.  The artery above and  below the plaque was normal.  A 10 shunt was passed up the internal carotid  artery and allowed to back-bleed and then the common carotid, where it was  secured with Rumel tourniquets.  The endarterectomy was begun on the common  carotid artery and the plaques was divided proximally with Potts scissors.  The endarterectomy was begun on the common carotid artery and the plaque was  divided with Potts scissors, extending onto the external carotid.  The  external carotid was then endarterectomized with an eversion technique and  the internal carotid was endarterectomized in an open fashion.  Remaining  atheromatous debris was removed from the endarterectomy plane.  The Finesse  Hemashield Dacron patch was brought onto the field and was sewn as a patch  angioplasty with a running 6-0 Prolene suture.  Prior to completion of the  anastomosis, the shunt was removed and usual flushing maneuvers undertaken.  The anastomosis was completed.  The external followed by the common and  finally the internal carotid occlusion clamps were removed.  Excellent flow  characteristics were noted with handheld Doppler in the internal and  external carotid arteries.  The patient was given 50 mg of protamine to  reverse the heparin.  The wounds were irrigated with saline and hemostased  with cautery.  Wounds were closed with several 3-0 Vicryl sutures to  reapproximate the sternocleidomastoid over the carotid sheath.  Next, the  platysma was closed with a running 3-0 Vicryl sutures and finally the skin  was closed with 4-0 subcuticular Vicryl stitch.  A sterile dressing was  applied.  The patient was awakened  neurologically unchanged from  preoperatively and was transferred to the recovery room in stable condition.      Larina Earthly, M.D.  Electronically Signed     TFE/MEDQ  D:  05/10/2005  T:  05/11/2005  Job:  [id number]   cc:   Reuben Likes, M.D.  Fax: [phone]

## 2010-12-14 NOTE — Op Note (Signed)
NAME:  Colin Parks, Colin Parks NO.:  [id number]   MEDICAL RECORD NO.:  [id number]          PATIENT TYPE:  AMB   LOCATION:  DSC                          FACILITY:  [facility]   PHYSICIAN:  Robert A. Thurston Hole, M.D. DATE OF BIRTH:  1951/04/30   DATE OF PROCEDURE:  11/12/2004  DATE OF DISCHARGE:                                 OPERATIVE REPORT   PREOPERATIVE DIAGNOSIS:  Left knee chondromalacia and synovitis.   POSTOPERATIVE DIAGNOSIS:  Left knee chondromalacia and synovitis.   PROCEDURE:  Left knee examination under anesthesia, followed by arthroscopic  chondroplasty with partial synovectomy.   SURGEON:  Elana Alm. Thurston Hole, M.D.   ASSISTANT:  Julien Girt, P.A.   ANESTHESIA:  Local and MAC.   OPERATIVE TIME:  30 minutes.   COMPLICATIONS:  None.   INDICATION FOR PROCEDURE:  Colin Parks is a 60 year old gentleman who has had  six months of increasing left knee pain with exam and MRI documenting  chondromalacia and synovitis with possible meniscal tearing.  He has failed  conservative care and is now to undergo arthroscopy.   DESCRIPTION:  Colin Parks was brought to the operating room on November 12, 2004,  after a knee block had been placed in the holding room.  He was placed on  the operating table in supine position.  After being placed under MAC  anesthesia, his left knee was examined.  He had range of motion of 0-130  degrees, 1-2+ crepitation, knee stable to ligamentous exam with normal  patellar tracking.  The left leg was prepped using sterile DuraPrep and  draped using sterile technique.  Originally through an anterolateral portal,  the arthroscope with a pump attached was placed in through an anteromedial  portal and the arthroscopic probe was placed.  On initial inspection of the  media compartment, he was found to have 50-60% grade 3 chondromalacia on the  medial femoral condyle, which was debrided.  Grade 1 and 2 changes, medial  tibial plateau.  Medial meniscus was  probed, and this was found to be  normal.  Intercondylar notch inspected, anterior and posterior cruciate  ligaments were normal.  Lateral compartment inspected.  He had 30% grade 3  chondromalacia of the lateral tibial plateau, which was debrided, grade 1  and 2 changes, lateral femoral condyle.  Lateral meniscus was intact.  Patellofemoral joint showed 50-70% grade 3 chondromalacia on the femoral  groove, which was debrided, 50% grade changes on the patella, which was  debrided.  The patella tracked normally.  There was significant synovitis in  the medial and lateral gutters, which was debrided.  Otherwise, they were  free of pathology.  After this was done, it was felt that all pathology had  been satisfactorily addressed.  The instruments were removed.  Portals were  closed with 3-0 nylon suture and injected with 0.25% Marcaine with  epinephrine and 4 mg of morphine, sterile dressings applied, and the patient  awakened and taken to the recovery room in stable condition.   FOLLOW-UP CARE:  Colin Parks will be followed as an outpatient on Talwin, Xanax  and  Naprosyn.  See me back in the office in a week for sutures out and  follow-up.       RAW/MEDQ  D:  11/12/2004  T:  11/12/2004  Job:  [id number]

## 2011-03-07 ENCOUNTER — Encounter: Payer: Self-pay | Admitting: Vascular Surgery

## 2011-03-12 ENCOUNTER — Ambulatory Visit (INDEPENDENT_AMBULATORY_CARE_PROVIDER_SITE_OTHER): Payer: 59 | Admitting: Vascular Surgery

## 2011-03-12 ENCOUNTER — Encounter: Payer: Self-pay | Admitting: Vascular Surgery

## 2011-03-12 ENCOUNTER — Encounter (INDEPENDENT_AMBULATORY_CARE_PROVIDER_SITE_OTHER): Payer: 59

## 2011-03-12 ENCOUNTER — Other Ambulatory Visit (INDEPENDENT_AMBULATORY_CARE_PROVIDER_SITE_OTHER): Payer: 59

## 2011-03-12 VITALS — BP 140/96 | HR 91 | Resp 16 | Ht 72.0 in | Wt 209.0 lb

## 2011-03-12 DIAGNOSIS — Z48812 Encounter for surgical aftercare following surgery on the circulatory system: Secondary | ICD-10-CM

## 2011-03-12 DIAGNOSIS — I771 Stricture of artery: Secondary | ICD-10-CM

## 2011-03-12 DIAGNOSIS — I6529 Occlusion and stenosis of unspecified carotid artery: Secondary | ICD-10-CM

## 2011-03-12 DIAGNOSIS — I739 Peripheral vascular disease, unspecified: Secondary | ICD-10-CM

## 2011-03-12 NOTE — Progress Notes (Signed)
Subjective:     Patient ID: Colin Parks, male   DOB: [birth year], 60 y.o.   MRN: [id number]  HPI   Review of Systems The patient presents today for followup of right subclavian stenting 02/06/2010. He is also status post bilateral carotid endarterectomies in the past. He denies any neurologic symptoms. Specifically he has had no dizziness no arm fatigue no transient ischemic attacks. He denies any new medical difficulties and no cardiac difficulties    Past Medical History  Diagnosis Date  . Hypertension   . Stroke   . Diabetes mellitus   . Hyperlipidemia   . Arthritis     History  Substance Use Topics  . Smoking status: Former Smoker    Quit date: 08/07/1971  . Smokeless tobacco: Not on file  . Alcohol Use: Yes     one beer once a month    Family History  Problem Relation Age of Onset  . Heart disease Mother   . Heart disease Sister     Allergies  Allergen Reactions  . Codeine     Current outpatient prescriptions:Acetaminophen (TYLENOL EXTRA STRENGTH PO), Take by mouth 4 (four) times daily as needed.  , Disp: , Rfl: ;  aspirin 81 MG tablet, Take 81 mg by mouth daily.  , Disp: , Rfl: ;  clopidogrel (PLAVIX) 75 MG tablet, Take 75 mg by mouth daily.  , Disp: , Rfl: ;  ezetimibe-simvastatin (VYTORIN) 10-20 MG per tablet, Take 1 tablet by mouth at bedtime.  , Disp: , Rfl:  lansoprazole (PREVACID) 15 MG capsule, Take 15 mg by mouth daily.  , Disp: , Rfl: ;  metFORMIN (GLUMETZA) 500 MG (MOD) 24 hr tablet, Take 500 mg by mouth 2 (two) times daily with a meal.  , Disp: , Rfl: ;  Multiple Vitamins-Minerals (GNP MEGA MULTI FOR MEN PO), Take by mouth 2 (two) times daily.  , Disp: , Rfl: ;  ramipril (ALTACE) 10 MG tablet, Take 10 mg by mouth daily.  , Disp: , Rfl:  sitaGLIPtin (JANUVIA) 100 MG tablet, Take 100 mg by mouth daily.  , Disp: , Rfl: ;  fexofenadine (ALLEGRA) 180 MG tablet, Take 180 mg by mouth daily.  , Disp: , Rfl: ;  indomethacin (INDOCIN SR) 75 MG CR capsule, Take 75 mg by  mouth daily with breakfast.  , Disp: , Rfl: ;  meloxicam (MOBIC) 15 MG tablet, Take 15 mg by mouth daily.  , Disp: , Rfl: ;  montelukast (SINGULAIR) 10 MG tablet, Take 10 mg by mouth at bedtime.  , Disp: , Rfl:   Filed Vitals:   03/12/11 1208  Height: 6' (1.829 m)  Weight: 209 lb (94.802 kg)    Body mass index is 28.35 kg/(m^2).       Objective:   Physical Exam     Well-developed nourished white male in no acute distress. Grossly intact neurologically.Bi lateral carotid incisions without bruits. No subclavian bruit. 2+ radial pulses bilaterally.  Carotid duplex shows no evidence of recurrent carotid stenosis. Subclavian duplex shows some turbulence and elevated velocities at the proximal stent. Assessment:     Status post right subclavian stent for occlusive disease. The patient has antegrade vertebral flow bilaterally. Normal postop carotid duplex.    Plan:     Repeat subclavian and carotid duplex study in 6 months for continued followup. The patient will notify us if he develops any neurologic deficits.

## 2011-03-22 NOTE — Procedures (Unsigned)
CAROTID DUPLEX EXAM  INDICATION:  Follow up carotid stenosis.  HISTORY: Diabetes:  Yes. Cardiac:  No. Hypertension:  Yes. Smoking:  Previous. Previous Surgery:  Right carotid endarterectomy on 06/18/2007; left carotid endarterectomy on 05/10/2005; right subclavian artery stent on 02/06/2010. CV History:  History of CVA in 2006. Amaurosis Fugax No, Paresthesias No, Hemiparesis No.                                      RIGHT             LEFT Brachial systolic pressure:         114               126 Brachial Doppler waveforms:         WNL               WNL Vertebral direction of flow:        Antegrade         Antegrade DUPLEX VELOCITIES (cm/sec) CCA peak systolic                   79                86 ECA peak systolic                   220               132 ICA peak systolic                   44                81 ICA end diastolic                   15                20 PLAQUE MORPHOLOGY:                  Homogenous        Homogenous PLAQUE AMOUNT:                      Mild              Mild PLAQUE LOCATION:                    CCA/ECA           CCA  IMPRESSION: 1. Bilateral internal carotid arteries are patent with history of     endarterectomies. 2. Right external carotid artery stenosis is present. 3. Bilateral common carotid arteries present with mild homogenous     plaque noted in the mid to distal segments without hemodynamically     significant changes present. 4. Patent antegrade vertebral arteries bilaterally. 5. Essentially unchanged bilaterally since study on 10/13/2009.  ___________________________________________ Larina Earthly, M.D.  SH/MEDQ  D:  03/13/2011  T:  03/13/2011  Job:  [id number]

## 2011-03-22 NOTE — Procedures (Unsigned)
VASCULAR LAB EXAM  INDICATION:  Subclavian stenosis and peripheral vascular disease.  HISTORY: Diabetes:  Yes. Cardiac:  No. Hypertension:  Yes. Smoking:  Previous. Previous surgery:  Right subclavian artery stent with PTA on 02/06/2010.  EXAM:  Right upper extremity arterial duplex  IMPRESSION: 1. Patent right proximal subclavian artery stent. 2. Elevated velocities proximal to stent in the right subclavian     artery suggestive of a hemodynamically significant stenosis with a     peak systolic velocity of 368 cm/s.  However, no significant plaque     formation can be identified. 3. Remainder of the right upper extremity arterial system appears     patent. 4. Right brachial pressure was 118 mmHg, and left brachial pressure     was 126 mmHg.     ___________________________________________ Larina Earthly, M.D.  SH/MEDQ  D:  03/13/2011  T:  03/13/2011  Job:  [id number]

## 2011-05-07 LAB — APTT: aPTT: 26

## 2011-05-07 LAB — COMPREHENSIVE METABOLIC PANEL
Albumin: 3.9
BUN: 9
Calcium: 9.5
Creatinine, Ser: 0.86
Total Bilirubin: 0.8
Total Protein: 6.7

## 2011-05-07 LAB — CBC
HCT: 34.9 — ABNORMAL LOW
HCT: 42.8
MCHC: 33.8
MCV: 83.4
Platelets: 271
Platelets: 320
RDW: 14.1
RDW: 14.1

## 2011-05-07 LAB — TYPE AND SCREEN
ABO/RH(D): O POS
Antibody Screen: NEGATIVE

## 2011-05-07 LAB — BASIC METABOLIC PANEL
BUN: 11
Creatinine, Ser: 0.85
GFR calc non Af Amer: >60
Glucose, Bld: 137 — ABNORMAL HIGH
Potassium: 3.7

## 2011-05-07 LAB — URINALYSIS, ROUTINE W REFLEX MICROSCOPIC
Glucose, UA: NEGATIVE
Ketones, ur: NEGATIVE
Nitrite: NEGATIVE
Specific Gravity, Urine: 1.008
pH: 6.5

## 2011-05-07 LAB — PROTIME-INR: INR: 0.9

## 2012-03-02 ENCOUNTER — Encounter: Payer: Self-pay | Admitting: Neurosurgery

## 2012-03-03 ENCOUNTER — Other Ambulatory Visit (INDEPENDENT_AMBULATORY_CARE_PROVIDER_SITE_OTHER): Payer: 59 | Admitting: *Deleted

## 2012-03-03 ENCOUNTER — Ambulatory Visit (INDEPENDENT_AMBULATORY_CARE_PROVIDER_SITE_OTHER): Payer: 59 | Admitting: Neurosurgery

## 2012-03-03 ENCOUNTER — Ambulatory Visit (INDEPENDENT_AMBULATORY_CARE_PROVIDER_SITE_OTHER): Payer: 59 | Admitting: *Deleted

## 2012-03-03 ENCOUNTER — Encounter: Payer: Self-pay | Admitting: Neurosurgery

## 2012-03-03 VITALS — BP 173/98 | HR 84 | Resp 18 | Ht 72.0 in | Wt 218.9 lb

## 2012-03-03 DIAGNOSIS — Z48812 Encounter for surgical aftercare following surgery on the circulatory system: Secondary | ICD-10-CM

## 2012-03-03 DIAGNOSIS — I714 Abdominal aortic aneurysm, without rupture, unspecified: Secondary | ICD-10-CM

## 2012-03-03 DIAGNOSIS — I6529 Occlusion and stenosis of unspecified carotid artery: Secondary | ICD-10-CM

## 2012-03-03 DIAGNOSIS — I739 Peripheral vascular disease, unspecified: Secondary | ICD-10-CM

## 2012-03-03 NOTE — Progress Notes (Signed)
[facility] Carotid Office Note  CC: Annual carotid and right subclavian surveillance Referring Physician: Early  History of Present Illness: 61 year old male patient of Dr. Arbie Cookey who is a history of a right subclavian stent placed in 2011, patient has bilateral carotid endarterectomies, right in 2008, (2006. The patient denies any signs or symptoms of CVA, TIA, amaurosis fugax or any neural deficit. Patient has no right upper extremity difficulties.  Past Medical History  Diagnosis Date  . Hypertension   . Diabetes mellitus   . Hyperlipidemia   . Arthritis   . Stroke 2006    ROS: [x]  Positive   [ ]  Denies    General: [ ]  Weight loss, [ ]  Fever, [ ]  chills Neurologic: [ ]  Dizziness, [ ]  Blackouts, [ ]  Seizure [ ]  Stroke, [ ]  "Mini stroke", [ ]  Slurred speech, [ ]  Temporary blindness; [ ]  weakness in arms or legs, [ ]  Hoarseness Cardiac: [ x] Chest pain/pressure, [ ]  Shortness of breath at rest [x ] Shortness of breath with exertion, [ ]  Atrial fibrillation or irregular heartbeat Vascular: [ ]  Pain in legs with walking, [ ]  Pain in legs at rest, [ ]  Pain in legs at night,  [ ]  Non-healing ulcer, [ ]  Blood clot in vein/DVT,   Pulmonary: [ ]  Home oxygen, [ ]  Productive cough, [ ]  Coughing up blood, [ ]  Asthma,  [ ]  Wheezing Musculoskeletal:  [ ]  Arthritis, [ ]  Low back pain, [ ]  Joint pain Hematologic: [ ]  Easy Bruising, [ ]  Anemia; [ ]  Hepatitis Gastrointestinal: [ ]  Blood in stool, [ ]  Gastroesophageal Reflux/heartburn, [ ]  Trouble swallowing Urinary: [ ]  chronic Kidney disease, [ ]  on HD - [ ]  MWF or [ ]  TTHS, [ ]  Burning with urination, [ ]  Difficulty urinating Skin: [ ]  Rashes, [ ]  Wounds Psychological: [ ]  Anxiety, [ ]  Depression   Social History History  Substance Use Topics  . Smoking status: Former Smoker    Quit date: 08/07/1971  . Smokeless tobacco: Not on file  . Alcohol Use: Yes     one beer once a month    Family History Family  History  Problem Relation Age of Onset  . Heart disease Mother   . Heart disease Sister     Allergies  Allergen Reactions  . Codeine Nausea And Vomiting    Current Outpatient Prescriptions  Medication Sig Dispense Refill  . Acetaminophen (TYLENOL EXTRA STRENGTH PO) Take by mouth 4 (four) times daily as needed.        Marland Kitchen aspirin 81 MG tablet Take 81 mg by mouth daily.        . clopidogrel (PLAVIX) 75 MG tablet Take 75 mg by mouth daily.        Marland Kitchen desoximetasone (TOPICORT) 0.25 % cream Apply 0.25 % topically as needed. Rash on legs      . ezetimibe-simvastatin (VYTORIN) 10-20 MG per tablet Take 1 tablet by mouth at bedtime.        . fexofenadine (ALLEGRA) 180 MG tablet Take 180 mg by mouth daily.        . lansoprazole (PREVACID) 15 MG capsule Take 15 mg by mouth daily.        . metFORMIN (GLUMETZA) 500 MG (MOD) 24 hr tablet Take 500 mg by mouth 2 (two) times daily with a meal.        . Multiple Vitamins-Minerals (GNP MEGA MULTI FOR MEN PO) Take by mouth 2 (two) times daily.        Marland Kitchen  ramipril (ALTACE) 10 MG tablet Take 10 mg by mouth daily.        . sitaGLIPtin (JANUVIA) 100 MG tablet Take 100 mg by mouth daily.        . indomethacin (INDOCIN SR) 75 MG CR capsule Take 75 mg by mouth daily with breakfast.        . meloxicam (MOBIC) 15 MG tablet Take 15 mg by mouth daily.        . montelukast (SINGULAIR) 10 MG tablet Take 10 mg by mouth at bedtime.          Physical Examination  Filed Vitals:   03/03/12 1017  BP: 173/98  Pulse: 84  Resp:     Body mass index is 29.69 kg/(m^2).  General:  WDWN in NAD Gait: Normal HEENT: WNL Eyes: Pupils equal Pulmonary: normal non-labored breathing , without Rales, rhonchi,  wheezing Cardiac: RRR, without  Murmurs, rubs or gallops; Abdomen: soft, NT, no masses Skin: no rashes, ulcers noted  Vascular Exam Pulses: 3+ radial pulses Carotid bruits: Carotid pulses to auscultation no bruits are heard Extremities without ischemic changes, no  Gangrene , no cellulitis; no open wounds;  Musculoskeletal: no muscle wasting or atrophy   Neurologic: A&O X 3; Appropriate Affect ; SENSATION: normal; MOTOR FUNCTION:  moving all extremities equally. Speech is fluent/normal  Non-Invasive Vascular Imaging CAROTID DUPLEX 03/03/2012  Right ICA 0 - 19% stenosis Left ICA 0 - 19% stenosis Patent right subclavian stent with some proximal elevation, (483)  ASSESSMENT/PLAN: Asymptomatic patient that will followup in one year with repeat subclavian duplex as well as carotid duplex. The patient's questions were encouraged and answered, he is in agreement with this plan.  Lauree Chandler ANP   Clinic MD: Early

## 2012-03-10 ENCOUNTER — Other Ambulatory Visit: Payer: 59

## 2012-03-12 NOTE — Procedures (Unsigned)
CAROTID DUPLEX EXAM  INDICATION:  Followup carotid stenosis.  HISTORY: Diabetes:  Yes Cardiac:  No Hypertension:  Yes Smoking:  Previous Previous Surgery:  Right carotid endarterectomy 06/18/2007; left carotid endarterectomy 05/10/2005; right subclavian stent 02/06/2010 CV History:  Asymptomatic Amaurosis Fugax No, Paresthesias No, Hemiparesis No                                      RIGHT             LEFT Brachial systolic pressure:         150               164 Brachial Doppler waveforms:         Triphasic         Triphasic Vertebral direction of flow:        Antegrade (abnormal)                Antegrade DUPLEX VELOCITIES (cm/sec) CCA peak systolic                   101               124 ECA peak systolic                   195               142 ICA peak systolic                   90                95 ICA end diastolic                   35                27 PLAQUE MORPHOLOGY:                  Homogeneous       Homogeneous PLAQUE AMOUNT:                      Mild              Mild PLAQUE LOCATION:                    CCA, ECA          CCA  IMPRESSION: 1. Patent carotid endarterectomy sites bilaterally. 2. Right external carotid artery stenosis.  ___________________________________________ Larina Earthly, M.D.  SS/MEDQ  D:  03/03/2012  T:  03/03/2012  Job:  [id number]

## 2012-05-05 ENCOUNTER — Other Ambulatory Visit: Payer: Self-pay | Admitting: *Deleted

## 2012-05-05 DIAGNOSIS — I771 Stricture of artery: Secondary | ICD-10-CM

## 2012-05-05 DIAGNOSIS — I6529 Occlusion and stenosis of unspecified carotid artery: Secondary | ICD-10-CM

## 2012-05-05 DIAGNOSIS — Z48812 Encounter for surgical aftercare following surgery on the circulatory system: Secondary | ICD-10-CM

## 2012-05-12 ENCOUNTER — Other Ambulatory Visit: Payer: Self-pay | Admitting: Cardiology

## 2012-05-13 NOTE — H&P (Signed)
Patient: Rayner, Toy H Provider: Bernal Luhman, MD  DOB: 05/16/1951   Age: 61 Y   Sex: Male Date: 05/11/2012  Phone: [phone]  Address: [address], [place], [id number]  Pcp: KARRAR HUSAIN    --------------------------------------------------------------------------------  Subjective:    CC:      1. MS/stress test f/u.      HPI:     General:           61-year-old with diabetes, hyperlipidemia, peripheral vascular disease with bilateral carotid endarterectomies, prior history of stroke who recently was experiencing midsternal chest tightness with exertion while doing yard work, relieved with rest then resuming with activity. His stress test showed an abnormality in the inferobasal/mid inferior wall distribution, possible ischemia. Normal EF. .      ROS:      The other elements of the review of systems are negative (12 total elements).     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      Surgical History: left knee surgery 2004, left ankle surgery , left carotid endarterectomy 2006, right carotid endarterectomy 2009, right subclaian stent PVD 2012.      Family History:  Father: deceased 57 yrs cad Mother: deceased 47 yrs aneurysm Brother 1: alive Sister 1: alive vasular problem Sister 2: alive 1 brother(s) , 2 sister(s) .      Social History:      General:          History of smoking  cigarettes: Never smoked.           no Smoking.          no Alcohol.          Caffeine: yes.          no Recreational drug use.          Diet: yes.          Exercise: yes.   Occupation: employed, manager at [employer]..          Marital Status: married.          Children: Boys, 1, girls, 1; 2 GC.      Medications: Allegra 180 MG Tablet 1 tablet as needed, Aspirin 81 MG Tablet Delayed Release 1 tablet Once a day, FreeStyle Test NA Strip TEST EVERY MORNING , Januvia 100MG Tablet TAKE 1 TABLET DAILY , Lansoprazole 15MG Capsule Delayed Release TAKE 1 CAPSULE BEFORE A MEAL ONCE A DAY , Metformin HCl 500MG Tablet TAKE 1 TABLET TWICE A DAY WITH MEALS , Desoximetasone 0.25 % Cream as directed twice a day as needed, Ramipril 10MG Capsule TAKE 1 CAPSULE DAILY , Plavix 75MG Tablet TAKE 1 TABLET DAILY , Vytorin 10/20MG Tablet 1 tablet Once a day, Glucosamine Chondr 1500 Complx Capsule as directed , Medication List reviewed and reconciled with the patient     Allergies: Codeine Sulfate: stomach upset, Statins Support.      Objective:    Vitals: Wt 218, Wt change 1 lb, Ht 70, BMI 31.28, Pulse sitting 100, BP sitting 152/80.     Examination:     General Examination:         GENERAL APPEARANCE alert,   oriented, NAD, pleasant.          SKIN: normal, no rash.          HEENT: bilateral neck scar noted. .          HEAD: [place]/AT.          EYES: EOMI, Conjunctiva clear.          NECK: supple, FROM, without evidence of thyromegaly, adenopathy, or bruits, no jugular venous distention (JVD).          CHEST: no subclavial bruit. Prior R stent. .          LUNGS: clear to auscultation bilaterally, no wheezes, rhonchi, rales, regular breathing rate and effort.          HEART: regular rate and rhythm, no S3, S4, murmur or rub, point of maximul impulse (PMI) normal.          ABDOMEN: soft, non-tender/non-distended, bowel sounds present, no masses palpated, no bruit.          EXTREMITIES: no clubbing, no edema, pulses 2 plus bilaterally.          NEUROLOGIC EXAM: non-focal exam, alert and oriented x 3.          PERIPHERAL PULSES: normal (2+) bilaterally.          LYMPH NODES: no  cervical adenopathy.          PSYCH affect normal.       Labs, med records, ecg reviewed.      Assessment:    Assessment:  1. Chest pain - 786.50 (Primary)   2. Abnormal cardiovascular stress test - 794.39   3. PVD - 443.9, Status post right subclavian stent, carotid disease, followup with vascular     Plan:    1. Chest pain        LAB: Basic Metabolic  GLu 177, creat 0.8      GLUCOSE 177 70-99 - mg/dL H       BUN 12 6-26 - mg/dL        CREATININE 0.81 0.60-1.30 - mg/dl        eGFR (NON-AFRICAN AMERICAN) 96 >60 - calc        eGFR (AFRICAN AMERICAN) 116 >60 - calc        SODIUM 139 136-145 - mmol/L        POTASSIUM 4.4 3.5-5.5 - mmol/L        CHLORIDE 104 98-107 - mmol/L        C02 28 22-32 - mmol/L        ANION GAP 11.0 6.0-20.0 - mmol/L        CALCIUM 9.5 8.6-10.3 - mg/dL               Kailon Treese 05/13/2012 12:43:12 PM > watch glucose. Otherwise OK Harward,Amy 05/13/2012 01:14:30 PM > lmom per dpr.        LAB: CBC with Diff  Hg 13.4     WBC 8.8 4.0-11.0 - K/ul        RBC 4.71 4.20-5.80 - M/uL        HGB 13.4 13.0-17.0 - g/dL        HCT 42.0 39.0-52.0 - %        MCH 28.4 27.0-33.0 - pg        MPV 8.1 7.5-10.7 - fL        MCV 89.1 80.0-94.0 - fL         MCHC 31.9 32.0-36.0 - g/dL L         RDW 15.2 11.5-15.5 - %        NRBC# 0.00 -        PLT 270 150-400 - K/uL        NEUT % 63.4 43.3-71.9 - %        NRBC% 0.00 - %        LYMPH% 22.0 16.8-43.5 - %        MONO % 7.8 4.6-12.4 - %        EOS % 5.9 0.0-7.8 - %        BASO % 0.9 0.0-1.0 - %        NEUT # 5.6 1.9-7.2 - K/uL        LYMPH# 1.90 1.10-2.70 - K/uL        MONO # 0.7 0.3-0.8 - K/uL        EOS # 0.5 0.0-0.6 - K/uL        BASO # 0.1 0.0-0.1 - K/uL               Hulon Ferron 05/13/2012 12:42:46 PM > ok Harward,Amy 05/13/2012 01:15:18 PM > lmom per dpr.        LAB: PT (Prothrombin Time) (005199)  normal     Prothrombin Time 10.3 9.1-12.0 - SEC        INR 1.0 0.8-1.2 -               Laquinton Bihm 05/13/2012 12:43:47  PM > ok for cath Harward,Amy 05/13/2012 01:15:10 PM > lmom per dpr.   With his recent chest discomfort, exertional, concerning for angina and his multitude of cardiac risk factors including peripheral vascular disease, I will proceed with cardiac catheterization. I will proceed via the radial artery approach. Note, a stent was placed in his right subclavian artery in 2012. If difficulty, will proceed with groin approach. Risks and benefits of cardiac catheterization have been reviewed including risk of stroke, heart attack, death, bleeding, renal impariment and arterial damage. There was ample oppurtuny to answer questions. Alternatives were discussed. Patient understands and wishes to proceed.          Immunizations:       Labs:      Procedure Codes: 80048 ECL BMP, 85025 ECL CBC PLATELET DIFF, 36415 BLOOD COLLECTION ROUTINE VENIPUNCTURE     Preventive:           Follow Up: post cath        Provider: Schon Zeiders, MD  Patient: Vosler, Raunel H  DOB: 12/08/1950  Date: 05/11/2012   

## 2012-05-14 ENCOUNTER — Encounter (HOSPITAL_COMMUNITY)
Admission: RE | Disposition: A | Discharge status: Home / Self Care | Payer: Self-pay | Source: Ambulatory Visit | Attending: Cardiology

## 2012-05-14 ENCOUNTER — Other Ambulatory Visit: Payer: Self-pay

## 2012-05-14 ENCOUNTER — Ambulatory Visit (HOSPITAL_COMMUNITY)
Admission: RE | Admit: 2012-05-14 | Discharge: 2012-05-14 | Disposition: A | Discharge status: Home / Self Care | Payer: 59 | Source: Ambulatory Visit | Attending: Cardiology | Admitting: Cardiology

## 2012-05-14 DIAGNOSIS — I10 Essential (primary) hypertension: Secondary | ICD-10-CM | POA: Insufficient documentation

## 2012-05-14 DIAGNOSIS — I2510 Atherosclerotic heart disease of native coronary artery without angina pectoris: Secondary | ICD-10-CM | POA: Diagnosis present

## 2012-05-14 DIAGNOSIS — E785 Hyperlipidemia, unspecified: Secondary | ICD-10-CM | POA: Insufficient documentation

## 2012-05-14 DIAGNOSIS — E119 Type 2 diabetes mellitus without complications: Secondary | ICD-10-CM | POA: Insufficient documentation

## 2012-05-14 DIAGNOSIS — Z8673 Personal history of transient ischemic attack (TIA), and cerebral infarction without residual deficits: Secondary | ICD-10-CM | POA: Insufficient documentation

## 2012-05-14 HISTORY — PX: LEFT HEART CATHETERIZATION WITH CORONARY ANGIOGRAM: SHX5451

## 2012-05-14 LAB — GLUCOSE, CAPILLARY: Glucose-Capillary: 167 mg/dL — ABNORMAL HIGH (ref 70–99)

## 2012-05-14 SURGERY — LEFT HEART CATHETERIZATION WITH CORONARY ANGIOGRAM
Anesthesia: LOCAL

## 2012-05-14 MED ORDER — SODIUM CHLORIDE 0.9 % IV SOLN
INTRAVENOUS | Status: DC
  Administered 2012-05-14: 1000 mL via INTRAVENOUS

## 2012-05-14 MED ORDER — NITROGLYCERIN 0.2 MG/ML ON CALL CATH LAB
INTRAVENOUS | Status: AC
  Filled 2012-05-14: qty 1

## 2012-05-14 MED ORDER — FENTANYL CITRATE 0.05 MG/ML IJ SOLN
INTRAMUSCULAR | Status: AC
  Filled 2012-05-14: qty 2

## 2012-05-14 MED ORDER — LIDOCAINE HCL (PF) 1 % IJ SOLN
INTRAMUSCULAR | Status: AC
  Filled 2012-05-14: qty 30

## 2012-05-14 MED ORDER — SODIUM CHLORIDE 0.9 % IJ SOLN: 3.0000 mL | Freq: Two times a day (BID) | INTRAMUSCULAR | Status: DC

## 2012-05-14 MED ORDER — ONDANSETRON HCL 4 MG/2ML IJ SOLN: 4.0000 mg | Freq: Four times a day (QID) | INTRAMUSCULAR | Status: DC | PRN

## 2012-05-14 MED ORDER — ACETAMINOPHEN 325 MG PO TABS
650.0000 mg | ORAL_TABLET | ORAL | Status: DC | PRN
  Administered 2012-05-14: 650 mg via ORAL

## 2012-05-14 MED ORDER — SODIUM CHLORIDE 0.9 % IV SOLN: 250.0000 mL | INTRAVENOUS | Status: DC | PRN

## 2012-05-14 MED ORDER — DIAZEPAM 5 MG PO TABS
5.0000 mg | ORAL_TABLET | ORAL | Status: AC
  Administered 2012-05-14: 5 mg via ORAL

## 2012-05-14 MED ORDER — MIDAZOLAM HCL 2 MG/2ML IJ SOLN
INTRAMUSCULAR | Status: AC
  Filled 2012-05-14: qty 2

## 2012-05-14 MED ORDER — ACETAMINOPHEN 325 MG PO TABS
ORAL_TABLET | ORAL | Status: AC
  Filled 2012-05-14: qty 2

## 2012-05-14 MED ORDER — HEPARIN (PORCINE) IN NACL 2-0.9 UNIT/ML-% IJ SOLN
INTRAMUSCULAR | Status: AC
  Filled 2012-05-14: qty 1500

## 2012-05-14 MED ORDER — ASPIRIN 81 MG PO CHEW
CHEWABLE_TABLET | ORAL | Status: AC
  Filled 2012-05-14: qty 4

## 2012-05-14 MED ORDER — DIAZEPAM 5 MG PO TABS
ORAL_TABLET | ORAL | Status: AC
  Filled 2012-05-14: qty 1

## 2012-05-14 MED ORDER — SODIUM CHLORIDE 0.9 % IV SOLN
1.0000 mL/kg/h | INTRAVENOUS | Status: AC
Start: 2012-05-14 — End: 2012-05-14

## 2012-05-14 MED ORDER — SODIUM CHLORIDE 0.9 % IJ SOLN: 3.0000 mL | INTRAMUSCULAR | Status: DC | PRN

## 2012-05-14 MED ORDER — ASPIRIN 81 MG PO CHEW
324.0000 mg | CHEWABLE_TABLET | ORAL | Status: AC
  Administered 2012-05-14: 324 mg via ORAL

## 2012-05-14 NOTE — Interval H&P Note (Signed)
History and Physical Interval Note:  05/14/2012 6:28 AM  Conrad  Car  has presented today for surgery, with the diagnosis of Chest pain  The various methods of treatment have been discussed with the patient and family. After consideration of risks, benefits and other options for treatment, the patient has consented to  Procedure(s) (LRB) with comments: LEFT HEART CATHETERIZATION WITH CORONARY ANGIOGRAM (N/A) as a surgical intervention .  The patient's history has been reviewed, patient examined, no change in status, stable for surgery.  I have reviewed the patient's chart and labs.  Questions were answered to the patient's satisfaction.     SKAINS, MARK

## 2012-05-14 NOTE — CV Procedure (Addendum)
CARDIAC CATHETERIZATION  PROCEDURE:  Left heart catheterization with selective coronary angiography, left ventriculogram, aortic arch angiogram  INDICATIONS:  61 year old male with significant peripheral vascular disease with previous CVA in 2006 with bilateral carotid endarterectomies in 2006 and 2009 followed by Dr. Arbie Cookey, right subclavian stenosis status post 2 stents placed in 01/2010 by Dr. Myra Gianotti who has been experiencing chest tightness with exertion while doing yard work relieved with rest consistent with angina. Basal inferior/mid inferior/inferolateral wall ischemia noted on stress test with normal EF. Recent evaluation of subclavian stent revealed patent stent. I reviewed carefully the procedure and 2 stents were placed because of migration of the first stent. Because of this, femoral approach was taken.  The risks, benefits, and details of the procedure were explained to the patient.  The patient verbalized understanding and wanted to proceed.  Informed written consent was obtained.  PROCEDURE TECHNIQUE:  After Xylocaine anesthesia and visualization of the femoral head via fluoroscopy, a 64F sheath was placed in the right femoral artery with a single anterior needle wall stick.   Left coronary angiography was done using a Judkins L4 catheter.  Right coronary angiography was done using a Judkins R4 catheter.  Left ventriculography was done using a pigtail catheter. Aortic angiogram was performed using pigtail and power injection. I attempted to selectively cannulate the left subclavian artery using guidewire and LIMA catheter but after several attempts, this was not successful. When withdrawing blood from the catheter, plaque was noted. A single attempt was made with Judkins right catheter once again unsuccessful at selective cannulization. He remained asymptomatic.   CONTRAST:  Total of 110 ml.   COMPLICATIONS:  None.    HEMODYNAMICS:  Aortic pressure was 125/67 mmHg; LV systolic pressure  was 125 mmHg; LVEDP  .  There was no gradient between the left ventricle and aorta.    ANGIOGRAPHIC DATA:    Left main: Patent, branches into circumflex as well as left anterior descending artery. No angiographically significant disease  Left anterior descending (LAD): Severe disease beginning in the proximal segment of the LAD with approximately 80% stenosis just prior to the bifurcation of a large diagonal branch. The remainder of the LAD, is diffusely diseased with lesions of up to 90% severity throughout its mid segment. The LAD is small in caliber and traverses to the apex. The first diagonal branch has approximately 99% stenosis proximally then bifurcates. The second diagonal branch also has 90% mid stenosis just after a mid vessel bifurcation.  Circumflex artery (CIRC): The circumflex artery is occluded just after the bifurcation of a moderate-sized first obtuse marginal branch. There are left to left collaterals slowly filling the remainder of the AV groove circumflex as well as second obtuse marginal branch.  Right coronary artery (RCA): There is proximal 30% stenosis, in the posterior descending artery, this vessel begins at its ostium is a moderate-sized vessel and has significant stenosis especially in its midportion of up to 90%.  LEFT VENTRICULOGRAM:  Left ventricular angiogram was done in the 30 RAO projection and revealed normal left ventricular wall motion and systolic function with an estimated ejection fraction of 65 %.   IMPRESSIONS:  1. Severe three-vessel coronary artery disease as described above, significant disease in the proximal/mid LAD, first and second diagonal, occluded mid circumflex with left to left collaterals, significant disease in mid PDA. 2. Normal left ventricular systolic function.  LVEDP 5 mmHg.  Ejection fraction 65 %. 3.  Normal-appearing aortic arch. Previously placed right subclavian stents noted.  RECOMMENDATION:  Referral to cardiothoracic  surgery for bypass. He is currently chest pain-free. I will monitor him and if stable, will allow him to go home with close followup. I described the findings to his wife and he and if any worrisome symptoms develop, he knows to seek medical attention immediately. Nitroglycerin will be prescribed.  I will ask him to stop his Plavix (10/17) in anticipation for bypass surgery. Continue with aspirin.

## 2012-05-14 NOTE — H&P (View-Only) (Signed)
Patient: Colin Parks Provider: Donato Schultz, MD  DOB: May 21, 1951   Age: 61 Y   Sex: Male Date: 05/11/2012  Phone: [phone]  Address: [address] [place], [place], [id number]  Pcp: Jerelyn Scott HUSAIN    --------------------------------------------------------------------------------  Subjective:    CC:      1. MS/stress test f/u.      HPI:     General:           61 year old with diabetes, hyperlipidemia, peripheral vascular disease with bilateral carotid endarterectomies, prior history of stroke who recently was experiencing midsternal chest tightness with exertion while doing yard work, relieved with rest then resuming with activity. His stress test showed an abnormality in the inferobasal/mid inferior wall distribution, possible ischemia. Normal EF. .      ROS:      The other elements of the review of systems are negative (12 total elements).     Medical History: Hypertension, Dyslipidemia, history of CVA, right-sided weakness mild, 2006, PVD, bilateral carotid endarterectomy, 2006 and 2009, followed by vascular doctor early, Dr Doylene Canning, Allergic rhinitis, Osteoarthritis mostly in the hand, type 2 diabetes, Dr Shea Evans, mild ritinopathy, Mild obesity, Right subclavian stenosis ,s/p stent palced in 2012, Colon polyp 2010 next 2015, Rash on leg, R shoulder and elbow, frozen shoulder, Dr Mina Marble ortho.      Surgical History: left knee surgery 2004, left ankle surgery , left carotid endarterectomy 2006, right carotid endarterectomy 2009, right subclaian stent PVD 2012.      Family History:  Father: deceased 75 yrs cad Mother: deceased 81 yrs aneurysm Brother 1: alive Sister 1: alive vasular problem Sister 2: alive 1 brother(s) , 2 sister(s) .      Social History:      General:          History of smoking  cigarettes: Never smoked.           no Smoking.          no Alcohol.          Caffeine: yes.          no Recreational drug use.          Diet: yes.          Exercise: yes.   Occupation: employed, Production designer, theatre/television/film at [employer]          Marital Status: married.          Children: Boys, 1, girls, 1; 2 GC.      Medications: Allegra 180 MG Tablet 1 tablet as needed, Aspirin 81 MG Tablet Delayed Release 1 tablet Once a day, FreeStyle Test NA Strip TEST EVERY MORNING , Januvia 100MG  Tablet TAKE 1 TABLET DAILY , Lansoprazole 15MG  Capsule Delayed Release TAKE 1 CAPSULE BEFORE A MEAL ONCE A DAY , Metformin HCl 500MG  Tablet TAKE 1 TABLET TWICE A DAY WITH MEALS , Desoximetasone 0.25 % Cream as directed twice a day as needed, Ramipril 10MG  Capsule TAKE 1 CAPSULE DAILY , Plavix 75MG  Tablet TAKE 1 TABLET DAILY , Vytorin 10/20MG  Tablet 1 tablet Once a day, Glucosamine Chondr 1500 Complx Capsule as directed , Medication List reviewed and reconciled with the patient     Allergies: Codeine Sulfate: stomach upset, Statins Support.      Objective:    Vitals: Wt 218, Wt change 1 lb, Ht 70, BMI 31.28, Pulse sitting 100, BP sitting 152/80.     Examination:     General Examination:         GENERAL APPEARANCE alert,  oriented, NAD, pleasant.          SKIN: normal, no rash.          HEENT: bilateral neck scar noted. Marland Kitchen          HEAD: [facility]/AT.          EYES: EOMI, Conjunctiva clear.          NECK: supple, FROM, without evidence of thyromegaly, adenopathy, or bruits, no jugular venous distention (JVD).          CHEST: no subclavial bruit. Prior R stent. .          LUNGS: clear to auscultation bilaterally, no wheezes, rhonchi, rales, regular breathing rate and effort.          HEART: regular rate and rhythm, no S3, S4, murmur or rub, point of maximul impulse (PMI) normal.          ABDOMEN: soft, non-tender/non-distended, bowel sounds present, no masses palpated, no bruit.          EXTREMITIES: no clubbing, no edema, pulses 2 plus bilaterally.          NEUROLOGIC EXAM: non-focal exam, alert and oriented x 3.          PERIPHERAL PULSES: normal (2+) bilaterally.          LYMPH NODES: no  cervical adenopathy.          PSYCH affect normal.       Labs, med records, ecg reviewed.      Assessment:    Assessment:  1. Chest pain - 786.50 (Primary)   2. Abnormal cardiovascular stress test - 794.39   3. PVD - 443.9, Status post right subclavian stent, carotid disease, followup with vascular     Plan:    1. Chest pain        LAB: Basic Metabolic  GLu 177, creat 0.8      GLUCOSE 177 70-99 - mg/dL H       BUN 12 1-61 - mg/dL        CREATININE 0.96 0.60-1.30 - mg/dl        eGFR (NON-AFRICAN AMERICAN) 96 >60 - calc        eGFR (AFRICAN AMERICAN) 116 >60 - calc        SODIUM 139 136-145 - mmol/L        POTASSIUM 4.4 3.5-5.5 - mmol/L        CHLORIDE 104 98-107 - mmol/L        C02 28 22-32 - mmol/L        ANION GAP 11.0 6.0-20.0 - mmol/L        CALCIUM 9.5 8.6-10.3 - mg/dL               [facility] 05/13/2012 12:43:12 PM > watch glucose. Otherwise OK Harward,Amy 05/13/2012 01:14:30 PM > lmom per dpr.        LAB: CBC with Diff  Hg 13.4     WBC 8.8 4.0-11.0 - K/ul        RBC 4.71 4.20-5.80 - M/uL        HGB 13.4 13.0-17.0 - g/dL        HCT 04.5 40.9-81.1 - %        MCH 28.4 27.0-33.0 - pg        MPV 8.1 7.5-10.7 - fL        MCV 89.1 80.0-94.0 - fL         MCHC 31.9 32.0-36.0 - g/dL L  RDW 15.2 11.5-15.5 - %        NRBC# 0.00 -        PLT 270 150-400 - K/uL        NEUT % 63.4 43.3-71.9 - %        NRBC% 0.00 - %        LYMPH% 22.0 16.8-43.5 - %        MONO % 7.8 4.6-12.4 - %        EOS % 5.9 0.0-7.8 - %        BASO % 0.9 0.0-1.0 - %        NEUT # 5.6 1.9-7.2 - K/uL        LYMPH# 1.90 1.10-2.70 - K/uL        MONO # 0.7 0.3-0.8 - K/uL        EOS # 0.5 0.0-0.6 - K/uL        BASO # 0.1 0.0-0.1 - K/uL               Brentley Landfair 05/13/2012 12:42:46 PM > ok Harward,Amy 05/13/2012 01:15:18 PM > lmom per dpr.        LAB: PT (Prothrombin Time) (409811)  normal     Prothrombin Time 10.3 9.1-12.0 - SEC        INR 1.0 0.8-1.2 -               Jayston Trevino 05/13/2012 12:43:47  PM > ok for cath [facility] 05/13/2012 01:15:10 PM > lmom per dpr.   With his recent chest discomfort, exertional, concerning for angina and his multitude of cardiac risk factors including peripheral vascular disease, I will proceed with cardiac catheterization. I will proceed via the radial artery approach. Note, a stent was placed in his right subclavian artery in 2012. If difficulty, will proceed with groin approach. Risks and benefits of cardiac catheterization have been reviewed including risk of stroke, heart attack, death, bleeding, renal impariment and arterial damage. There was ample oppurtuny to answer questions. Alternatives were discussed. Patient understands and wishes to proceed.          Immunizations:       Labs:      Procedure Codes: 91478 ECL BMP, 85025 ECL CBC PLATELET DIFF, 29562 BLOOD COLLECTION ROUTINE VENIPUNCTURE     Preventive:           Follow Up: post cath        Provider: Donato Schultz, MD  Patient: Colin Parks  DOB: Dec 07, 1950  Date: 05/11/2012

## 2012-05-18 ENCOUNTER — Encounter: Payer: Self-pay | Admitting: *Deleted

## 2012-05-18 DIAGNOSIS — I739 Peripheral vascular disease, unspecified: Secondary | ICD-10-CM | POA: Insufficient documentation

## 2012-05-19 ENCOUNTER — Encounter (HOSPITAL_COMMUNITY): Payer: Self-pay | Admitting: Pharmacy Technician

## 2012-05-19 ENCOUNTER — Encounter: Payer: Self-pay | Admitting: *Deleted

## 2012-05-19 ENCOUNTER — Other Ambulatory Visit: Payer: Self-pay

## 2012-05-19 ENCOUNTER — Institutional Professional Consult (permissible substitution) (INDEPENDENT_AMBULATORY_CARE_PROVIDER_SITE_OTHER): Payer: 59 | Admitting: Thoracic Surgery (Cardiothoracic Vascular Surgery)

## 2012-05-19 ENCOUNTER — Encounter: Payer: Self-pay | Admitting: Thoracic Surgery (Cardiothoracic Vascular Surgery)

## 2012-05-19 ENCOUNTER — Other Ambulatory Visit: Payer: Self-pay | Admitting: Thoracic Surgery (Cardiothoracic Vascular Surgery)

## 2012-05-19 VITALS — BP 136/94 | HR 88 | Resp 18 | Ht 72.0 in | Wt 218.0 lb

## 2012-05-19 DIAGNOSIS — I2510 Atherosclerotic heart disease of native coronary artery without angina pectoris: Secondary | ICD-10-CM

## 2012-05-19 NOTE — Progress Notes (Signed)
PCP is Georgann Housekeeper, MD Referring Provider is Donato Schultz, MD  Chief Complaint  Patient presents with  . Coronary Artery Disease    Referral from Dr Anne Fu for surgical eval on CAD, Cardiac Cath on 05/14/12    HPI: 61 year old male with a history of atherosclerotic cardiovascular disease who present with chief complaint of chest pain.  Mr. Kosik is a 61 year old gentleman with a history of diabetes, hypertension, dyslipidemia, and peripheral arterial disease. He had a stroke with mild right-sided weakness in 2006 and underwent carotid endarterectomy on the left and 2006 and on the right and 2009. He also had a subclavian stenosis which required a stent in 2012. He had no history of cardiac disease until recently. About a month ago he was out working in his yard and experienced chest tightness. This was midsternal and was relieved with rest. He then started working again and the pain recurred and was accompanied by a hot flash and diaphoresis. He went inside and his symptoms resolved. He then saw Dr. Donette Larry who recommended a stress test. It showed possible ischemia in the inferior basal/mid inferior wall distribution. He has a normal ejection fraction. He was referred to Dr. scanning screw recommend a cardiac catheterization. That was done on 05/14/2012. It revealed severe three-vessel coronary disease with normal left ventricular function. He has not had any more episodes of chest tightness, but he does complain of some transient left-sided chest discomfort usually lasting only 2-3 minutes.   Past Medical History  Diagnosis Date  . Hypertension   . Diabetes mellitus   . Hyperlipidemia   . Arthritis   . PVD (peripheral vascular disease)   . Stroke 2006    Past Surgical History  Procedure Date  . Angioplasty 05/10/2005    Dacron patch  . Cardiac catheterization 02/06/2010    Aortic Arch - Second order catheterization (right subclavian artery)  . Carotid stent 02/06/2010    right  subclavian artery  . Hand surgery right hand cyst removed from knuckle per pt.    2008  . Knee surgery left knee 2004  . Ankle surgery left ankle 2002  . Carotid endarterectomy 05/10/2005    Left  . Carotid endarterectomy 2009    Dr. Arbie Cookey    Family History  Problem Relation Age of Onset  . Heart disease Mother   . Heart disease Father   . Hypertension Mother     Social History History  Substance Use Topics  . Smoking status: Former Smoker    Quit date: 08/07/1971  . Smokeless tobacco: Not on file  . Alcohol Use: Yes     one beer once a month    Current Outpatient Prescriptions  Medication Sig Dispense Refill  . aspirin 81 MG tablet Take 81 mg by mouth daily.        Marland Kitchen desoximetasone (TOPICORT) 0.25 % cream Apply 0.25 % topically as needed. Rash on legs      . ezetimibe-simvastatin (VYTORIN) 10-20 MG per tablet Take 1 tablet by mouth at bedtime.        . fexofenadine (ALLEGRA) 180 MG tablet Take 180 mg by mouth daily.        . Glucosamine-Chondroit-Vit C-Mn (GLUCOSAMINE CHONDR 1500 COMPLX PO) Take 1 capsule by mouth.      . lansoprazole (PREVACID) 15 MG capsule Take 15 mg by mouth daily.        . metFORMIN (GLUMETZA) 500 MG (MOD) 24 hr tablet Take 500 mg by mouth 2 (two) times daily with a  meal.        . Multiple Vitamins-Minerals (GNP MEGA MULTI FOR MEN PO) Take by mouth 2 (two) times daily.        . ramipril (ALTACE) 10 MG tablet Take 10 mg by mouth daily.        . sitaGLIPtin (JANUVIA) 100 MG tablet Take 100 mg by mouth daily.          Allergies  Allergen Reactions  . Codeine Nausea And Vomiting    Review of Systems  Constitutional: Negative for activity change, appetite change and unexpected weight change.  Eyes:       Glasses  Respiratory: Negative.   Cardiovascular: Positive for chest pain. Negative for leg swelling.       CEA bilateral, right subclavian stent, legs feel tired late in day  Genitourinary: Negative.   Musculoskeletal: Positive for joint  swelling and arthralgias.  Neurological: Negative for dizziness, syncope, speech difficulty and weakness.       CVA in 2007- no residual, but has to be careful going down stairs  Hematological: Does not bruise/bleed easily.  All other systems reviewed and are negative.    BP 136/94  Pulse 88  Resp 18  Ht 6' (1.829 m)  Wt 218 lb (98.884 kg)  BMI 29.57 kg/m2  SpO2 95% Physical Exam  Vitals reviewed. Constitutional: He is oriented to person, place, and time. He appears well-developed and well-nourished. No distress.  HENT:  Head: Normocephalic and atraumatic.  Eyes: EOM are normal. Pupils are equal, round, and reactive to light.  Neck: No thyromegaly present.       Well healed surgical scars, no bruits  Cardiovascular: Normal rate, regular rhythm and normal heart sounds.   No murmur heard. Pulmonary/Chest: Effort normal and breath sounds normal. He has no wheezes. He has no rales.  Abdominal: Soft. There is no tenderness.  Musculoskeletal: He exhibits no edema.  Lymphadenopathy:    He has no cervical adenopathy.  Neurological: He is alert and oriented to person, place, and time. No cranial nerve deficit.  Skin: Skin is warm and dry.     Diagnostic Tests: Cardiac catheterization 05/14/2012 CARDIAC CATHETERIZATION  PROCEDURE: Left heart catheterization with selective coronary angiography, left ventriculogram, aortic arch angiogram  INDICATIONS: 61 year old male with significant peripheral vascular disease with previous CVA in 2006 with bilateral carotid endarterectomies in 2006 and 2009 followed by Dr. Arbie Cookey, right subclavian stenosis status post 2 stents placed in 01/2010 by Dr. Myra Gianotti who has been experiencing chest tightness with exertion while doing yard work relieved with rest consistent with angina. Basal inferior/mid inferior/inferolateral wall ischemia noted on stress test with normal EF. Recent evaluation of subclavian stent revealed patent stent. I reviewed carefully the  procedure and 2 stents were placed because of migration of the first stent. Because of this, femoral approach was taken.  The risks, benefits, and details of the procedure were explained to the patient. The patient verbalized understanding and wanted to proceed. Informed written consent was obtained.  PROCEDURE TECHNIQUE: After Xylocaine anesthesia and visualization of the femoral head via fluoroscopy, a 64F sheath was placed in the right femoral artery with a single anterior needle wall stick. Left coronary angiography was done using a Judkins L4 catheter. Right coronary angiography was done using a Judkins R4 catheter. Left ventriculography was done using a pigtail catheter. Aortic angiogram was performed using pigtail and power injection. I attempted to selectively cannulate the left subclavian artery using guidewire and LIMA catheter but after several attempts, this was  not successful. When withdrawing blood from the catheter, plaque was noted. A single attempt was made with Judkins right catheter once again unsuccessful at selective cannulization. He remained asymptomatic.  CONTRAST: Total of 110 ml.  COMPLICATIONS: None.  HEMODYNAMICS: Aortic pressure was 125/67 mmHg; LV systolic pressure was 125 mmHg; LVEDP . There was no gradient between the left ventricle and aorta.  ANGIOGRAPHIC DATA:  Left main: Patent, branches into circumflex as well as left anterior descending artery. No angiographically significant disease  Left anterior descending (LAD): Severe disease beginning in the proximal segment of the LAD with approximately 80% stenosis just prior to the bifurcation of a large diagonal branch. The remainder of the LAD, is diffusely diseased with lesions of up to 90% severity throughout its mid segment. The LAD is small in caliber and traverses to the apex. The first diagonal branch has approximately 99% stenosis proximally then bifurcates. The second diagonal branch also has 90% mid stenosis just  after a mid vessel bifurcation.  Circumflex artery (CIRC): The circumflex artery is occluded just after the bifurcation of a moderate-sized first obtuse marginal branch. There are left to left collaterals slowly filling the remainder of the AV groove circumflex as well as second obtuse marginal branch.  Right coronary artery (RCA): There is proximal 30% stenosis, in the posterior descending artery, this vessel begins at its ostium is a moderate-sized vessel and has significant stenosis especially in its midportion of up to 90%.  LEFT VENTRICULOGRAM: Left ventricular angiogram was done in the 30 RAO projection and revealed normal left ventricular wall motion and systolic function with an estimated ejection fraction of 65 %.  IMPRESSIONS:  1. Severe three-vessel coronary artery disease as described above, significant disease in the proximal/mid LAD, first and second diagonal, occluded mid circumflex with left to left collaterals, significant disease in mid PDA. 2. Normal left ventricular systolic function. LVEDP 5 mmHg. Ejection fraction 65 %. 3. Normal-appearing aortic arch. Previously placed right subclavian stents noted.  RECOMMENDATION: Referral to cardiothoracic surgery for bypass. He is currently chest pain-free. I will monitor him and if stable, will allow him to go home with close followup. I described the findings to his wife and he and if any worrisome symptoms develop, he knows to seek medical attention immediately. Nitroglycerin will be prescribed.  I will ask him to stop his Plavix (10/17) in anticipation for bypass surgery. Continue with aspirin.  Carotid duplex 03/13/2012 Impression: 1. Patent carotid endarterectomy sites bilaterally  2. Right external carotid stenosis  Vascular lab exam 03/13/2012 Impression: 1. Patent right proximal subclavian artery stent 2. Elevated velocities noted proximal to and distal to stent, limited visualization. 3. Remainder of right upper extremity  arterial system appears patent   Impression:  61 year old gentleman with multiple cardiac risk factors and known peripheral arterial disease, who is now found to have three-vessel coronary artery disease. He does have preserved left ventricular function. His disease is too extensive for stenting. Coronary artery bypass grafting is indicated for survival benefit as well as relief of symptoms.  I discussed in detail with the patient and his wife the nature of coronary bypass grafting including the need for general anesthesia, incisions be used, use of cardiopulmonary bypass, expected hospital stay, and overall recovery. He is not a candidate for bilateral mammary arteries due to his diabetes. He has an abnormal Allen's test on the left which precludes the use of the left radial artery.  I discussed with the patient and his wife the indications, risks, benefits, and  alternative treatments. They understand the risk of coronary bypass grafting include but are not limited to death, stroke, MI, DVT, PE, bleeding, possible need for transfusion, infection, cardiac arrhythmias, and other organ system dysfunction including respiratory, renal, or gastrointestinal complications.  He accepts these risks and wishes to proceed with surgery as soon as possible.  Plan: Coronary bypass grafting on Friday, 05/22/2012. He will be admitted on the day of surgery.  He had been on Plavix but that was stopped on 05/14/2012, so that will not be an issue with the surgery.

## 2012-05-19 NOTE — Pre-Procedure Instructions (Signed)
[address] Ruminski  05/19/2012   Your procedure is scheduled on:  Fri, Oct 25 @ 7:30 AM  Report to [facility] at 5:30 AM.  Call this number if you have problems the morning of surgery: [phone]   Remember:   Do not eat food:After Midnight.  Take these medicines the morning of surgery with A SIP OF WATER: Lansoprazole(Prevacid),Allegra(Fexofenadine), and Metoprolol(Lopressor)   Do not wear jewelry  Do not wear lotions, powders, or colognes. You may wear deodorant.  Men may shave face and neck.  Do not bring valuables to the hospital.  Contacts, dentures or bridgework may not be worn into surgery.  Leave suitcase in the car. After surgery it may be brought to your room.  For patients admitted to the hospital, checkout time is 11:00 AM the day of discharge.   Patients discharged the day of surgery will not be allowed to drive home.  Special Instructions: Shower using CHG 2 nights before surgery and the night before surgery.  If you shower the day of surgery use CHG.  Use special wash - you have one bottle of CHG for all showers.  You should use approximately 1/3 of the bottle for each shower.Incentive Spirometry   Please read over the following fact sheets that you were given: Pain Booklet, Coughing and Deep Breathing, Blood Transfusion Information, Open Heart Packet, MRSA Information and Surgical Site Infection Prevention

## 2012-05-20 ENCOUNTER — Ambulatory Visit (HOSPITAL_COMMUNITY)
Admission: RE | Admit: 2012-05-20 | Discharge: 2012-05-20 | Disposition: A | Discharge status: Home / Self Care | Payer: 59 | Source: Ambulatory Visit | Attending: Thoracic Surgery (Cardiothoracic Vascular Surgery) | Admitting: Thoracic Surgery (Cardiothoracic Vascular Surgery)

## 2012-05-20 ENCOUNTER — Encounter (HOSPITAL_COMMUNITY): Payer: Self-pay

## 2012-05-20 ENCOUNTER — Encounter (HOSPITAL_COMMUNITY)
Admission: RE | Admit: 2012-05-20 | Discharge: 2012-05-20 | Disposition: A | Discharge status: Home / Self Care | Payer: 59 | Source: Ambulatory Visit | Attending: Thoracic Surgery (Cardiothoracic Vascular Surgery) | Admitting: Thoracic Surgery (Cardiothoracic Vascular Surgery)

## 2012-05-20 VITALS — BP 128/78 | HR 86 | Temp 97.9°F | Resp 20 | Ht 72.0 in | Wt 216.6 lb

## 2012-05-20 DIAGNOSIS — I2510 Atherosclerotic heart disease of native coronary artery without angina pectoris: Secondary | ICD-10-CM

## 2012-05-20 DIAGNOSIS — Z01811 Encounter for preprocedural respiratory examination: Secondary | ICD-10-CM | POA: Insufficient documentation

## 2012-05-20 HISTORY — DX: Atherosclerotic heart disease of native coronary artery without angina pectoris: I25.10

## 2012-05-20 LAB — SURGICAL PCR SCREEN
MRSA, PCR: NEGATIVE
Staphylococcus aureus: NEGATIVE

## 2012-05-20 LAB — BLOOD GAS, ARTERIAL
Bicarbonate: 21.4 mEq/L (ref 20.0–24.0)
pCO2 arterial: 31.7 mmHg — ABNORMAL LOW (ref 35.0–45.0)
pH, Arterial: 7.443 (ref 7.350–7.450)
pO2, Arterial: 125 mmHg — ABNORMAL HIGH (ref 80.0–100.0)

## 2012-05-20 LAB — URINALYSIS, ROUTINE W REFLEX MICROSCOPIC
Ketones, ur: NEGATIVE mg/dL
Leukocytes, UA: NEGATIVE
Nitrite: NEGATIVE
Protein, ur: NEGATIVE mg/dL
Urobilinogen, UA: 0.2 mg/dL (ref 0.0–1.0)
pH: 5 (ref 5.0–8.0)

## 2012-05-20 LAB — COMPREHENSIVE METABOLIC PANEL
ALT: 15 U/L (ref 0–53)
Alkaline Phosphatase: 80 U/L (ref 39–117)
CO2: 20 mEq/L (ref 19–32)
GFR calc Af Amer: >90 mL/min (ref >90)
Glucose, Bld: 244 mg/dL — ABNORMAL HIGH (ref 70–99)
Potassium: 4.3 mEq/L (ref 3.5–5.1)
Sodium: 134 mEq/L — ABNORMAL LOW (ref 135–145)
Total Protein: 7.3 g/dL (ref 6.0–8.3)

## 2012-05-20 LAB — CBC
Hemoglobin: 13.7 g/dL (ref 13.0–17.0)
MCHC: 34.3 g/dL (ref 30.0–36.0)
RBC: 4.83 MIL/uL (ref 4.22–5.81)

## 2012-05-20 LAB — PROTIME-INR: INR: 0.95 (ref 0.00–1.49)

## 2012-05-20 LAB — TYPE AND SCREEN
ABO/RH(D): O POS
Antibody Screen: NEGATIVE

## 2012-05-20 MED ORDER — CHLORHEXIDINE GLUCONATE 4 % EX LIQD: 30.0000 mL | CUTANEOUS | Status: DC

## 2012-05-20 NOTE — Progress Notes (Signed)
Shari Heritage confirmed that doppler studies already done and Dr. Dorris Fetch has seen.  Requested Stress test, ECHO and OV from Dr. Anne Fu office.

## 2012-05-21 DIAGNOSIS — Z0279 Encounter for issue of other medical certificate: Secondary | ICD-10-CM

## 2012-05-21 MED ORDER — METOPROLOL TARTRATE 12.5 MG HALF TABLET: 12.5000 mg | ORAL_TABLET | Freq: Once | ORAL | Status: DC

## 2012-05-21 MED ORDER — PHENYLEPHRINE HCL 10 MG/ML IJ SOLN
30.0000 ug/min | INTRAVENOUS | Status: DC
  Filled 2012-05-21: qty 2

## 2012-05-21 MED ORDER — POTASSIUM CHLORIDE 2 MEQ/ML IV SOLN
80.0000 meq | INTRAVENOUS | Status: DC
  Filled 2012-05-21: qty 40

## 2012-05-21 MED ORDER — PLASMA-LYTE 148 IV SOLN
INTRAVENOUS | Status: AC
  Administered 2012-05-22: 09:00:00
  Filled 2012-05-21: qty 2.5

## 2012-05-21 MED ORDER — VANCOMYCIN HCL 1000 MG IV SOLR
1500.0000 mg | INTRAVENOUS | Status: AC
  Administered 2012-05-22: 1500 mg via INTRAVENOUS
  Filled 2012-05-21: qty 1500

## 2012-05-21 MED ORDER — SODIUM CHLORIDE 0.9 % IV SOLN
INTRAVENOUS | Status: AC
  Administered 2012-05-22: 70 mL/h via INTRAVENOUS
  Filled 2012-05-21: qty 40

## 2012-05-21 MED ORDER — DEXTROSE 5 % IV SOLN
750.0000 mg | INTRAVENOUS | Status: DC
  Filled 2012-05-21: qty 750

## 2012-05-21 MED ORDER — EPINEPHRINE HCL 1 MG/ML IJ SOLN
0.5000 ug/min | INTRAVENOUS | Status: DC
  Filled 2012-05-21: qty 4

## 2012-05-21 MED ORDER — MAGNESIUM SULFATE 50 % IJ SOLN
40.0000 meq | INTRAMUSCULAR | Status: DC
  Filled 2012-05-21: qty 10

## 2012-05-21 MED ORDER — DEXTROSE 5 % IV SOLN
1.5000 g | INTRAVENOUS | Status: AC
  Administered 2012-05-22: 1.5 g via INTRAVENOUS
  Administered 2012-05-22: .75 g via INTRAVENOUS
  Filled 2012-05-21: qty 1.5

## 2012-05-21 MED ORDER — NITROGLYCERIN IN D5W 200-5 MCG/ML-% IV SOLN
2.0000 ug/min | INTRAVENOUS | Status: AC
  Administered 2012-05-22: 5 ug/min via INTRAVENOUS
  Filled 2012-05-21 (×2): qty 250

## 2012-05-21 MED ORDER — SODIUM CHLORIDE 0.9 % IV SOLN
INTRAVENOUS | Status: AC
  Administered 2012-05-22: 2 [IU]/h via INTRAVENOUS
  Filled 2012-05-21: qty 1

## 2012-05-21 MED ORDER — DOPAMINE-DEXTROSE 3.2-5 MG/ML-% IV SOLN
2.0000 ug/kg/min | INTRAVENOUS | Status: DC
  Filled 2012-05-21: qty 250

## 2012-05-21 MED ORDER — DEXMEDETOMIDINE HCL IN NACL 400 MCG/100ML IV SOLN
0.1000 ug/kg/h | INTRAVENOUS | Status: AC
  Administered 2012-05-22: 0.2 ug/kg/h via INTRAVENOUS
  Filled 2012-05-21: qty 100

## 2012-05-22 ENCOUNTER — Inpatient Hospital Stay (HOSPITAL_COMMUNITY): Payer: 59

## 2012-05-22 ENCOUNTER — Encounter (HOSPITAL_COMMUNITY): Payer: Self-pay | Admitting: Anesthesiology

## 2012-05-22 ENCOUNTER — Encounter (HOSPITAL_COMMUNITY): Payer: Self-pay | Admitting: *Deleted

## 2012-05-22 ENCOUNTER — Inpatient Hospital Stay (HOSPITAL_COMMUNITY): Payer: 59 | Admitting: Anesthesiology

## 2012-05-22 ENCOUNTER — Inpatient Hospital Stay (HOSPITAL_COMMUNITY)
Admission: RE | Admit: 2012-05-22 | Discharge: 2012-05-26 | DRG: 236 | Disposition: A | Discharge status: Home / Self Care | Payer: 59 | Source: Ambulatory Visit | Attending: Thoracic Surgery (Cardiothoracic Vascular Surgery) | Admitting: Thoracic Surgery (Cardiothoracic Vascular Surgery)

## 2012-05-22 ENCOUNTER — Encounter (HOSPITAL_COMMUNITY)
Admission: RE | Disposition: A | Discharge status: Home / Self Care | Payer: Self-pay | Source: Ambulatory Visit | Attending: Thoracic Surgery (Cardiothoracic Vascular Surgery)

## 2012-05-22 DIAGNOSIS — E119 Type 2 diabetes mellitus without complications: Secondary | ICD-10-CM | POA: Diagnosis present

## 2012-05-22 DIAGNOSIS — E785 Hyperlipidemia, unspecified: Secondary | ICD-10-CM | POA: Diagnosis present

## 2012-05-22 DIAGNOSIS — I498 Other specified cardiac arrhythmias: Secondary | ICD-10-CM | POA: Diagnosis not present

## 2012-05-22 DIAGNOSIS — I209 Angina pectoris, unspecified: Secondary | ICD-10-CM | POA: Diagnosis present

## 2012-05-22 DIAGNOSIS — Z79899 Other long term (current) drug therapy: Secondary | ICD-10-CM

## 2012-05-22 DIAGNOSIS — I714 Abdominal aortic aneurysm, without rupture, unspecified: Secondary | ICD-10-CM | POA: Diagnosis present

## 2012-05-22 DIAGNOSIS — I2510 Atherosclerotic heart disease of native coronary artery without angina pectoris: Principal | ICD-10-CM

## 2012-05-22 DIAGNOSIS — D62 Acute posthemorrhagic anemia: Secondary | ICD-10-CM | POA: Diagnosis not present

## 2012-05-22 DIAGNOSIS — I4891 Unspecified atrial fibrillation: Secondary | ICD-10-CM | POA: Diagnosis not present

## 2012-05-22 DIAGNOSIS — K5900 Constipation, unspecified: Secondary | ICD-10-CM | POA: Diagnosis not present

## 2012-05-22 DIAGNOSIS — I10 Essential (primary) hypertension: Secondary | ICD-10-CM | POA: Diagnosis present

## 2012-05-22 DIAGNOSIS — Z951 Presence of aortocoronary bypass graft: Secondary | ICD-10-CM

## 2012-05-22 DIAGNOSIS — I739 Peripheral vascular disease, unspecified: Secondary | ICD-10-CM | POA: Diagnosis present

## 2012-05-22 DIAGNOSIS — Z7982 Long term (current) use of aspirin: Secondary | ICD-10-CM

## 2012-05-22 DIAGNOSIS — Z8673 Personal history of transient ischemic attack (TIA), and cerebral infarction without residual deficits: Secondary | ICD-10-CM

## 2012-05-22 DIAGNOSIS — Z01811 Encounter for preprocedural respiratory examination: Secondary | ICD-10-CM

## 2012-05-22 DIAGNOSIS — Z87891 Personal history of nicotine dependence: Secondary | ICD-10-CM

## 2012-05-22 DIAGNOSIS — M129 Arthropathy, unspecified: Secondary | ICD-10-CM | POA: Diagnosis present

## 2012-05-22 HISTORY — PX: ENDOVEIN HARVEST OF GREATER SAPHENOUS VEIN: SHX5059

## 2012-05-22 HISTORY — PX: CORONARY ARTERY BYPASS GRAFT: SHX141

## 2012-05-22 LAB — POCT I-STAT 3, ART BLOOD GAS (G3+)
Acid-Base Excess: 1 mmol/L (ref 0.0–2.0)
Bicarbonate: 26.2 mEq/L — ABNORMAL HIGH (ref 20.0–24.0)
Bicarbonate: 25.4 mEq/L — ABNORMAL HIGH (ref 20.0–24.0)
Bicarbonate: 26.2 mEq/L — ABNORMAL HIGH (ref 20.0–24.0)
O2 Saturation: 100 %
O2 Saturation: 100 %
O2 Saturation: 99 %
Patient temperature: 36
Patient temperature: 36.9
Patient temperature: 36.9
TCO2: 28 mmol/L (ref 0–100)
pCO2 arterial: 44.4 mmHg (ref 35.0–45.0)
pCO2 arterial: 44.3 mmHg (ref 35.0–45.0)
pCO2 arterial: 40.7 mmHg (ref 35.0–45.0)
pH, Arterial: 7.384 (ref 7.350–7.450)
pH, Arterial: 7.354 (ref 7.350–7.450)
pO2, Arterial: 255 mmHg — ABNORMAL HIGH (ref 80.0–100.0)
pO2, Arterial: 263 mmHg — ABNORMAL HIGH (ref 80.0–100.0)

## 2012-05-22 LAB — GLUCOSE, CAPILLARY
Glucose-Capillary: 102 mg/dL — ABNORMAL HIGH (ref 70–99)
Glucose-Capillary: 106 mg/dL — ABNORMAL HIGH (ref 70–99)
Glucose-Capillary: 122 mg/dL — ABNORMAL HIGH (ref 70–99)
Glucose-Capillary: 113 mg/dL — ABNORMAL HIGH (ref 70–99)
Glucose-Capillary: 99 mg/dL (ref 70–99)
Glucose-Capillary: 164 mg/dL — ABNORMAL HIGH (ref 70–99)
Glucose-Capillary: 143 mg/dL — ABNORMAL HIGH (ref 70–99)
Glucose-Capillary: 143 mg/dL — ABNORMAL HIGH (ref 70–99)

## 2012-05-22 LAB — CREATININE, SERUM
GFR calc Af Amer: >90 mL/min (ref >90)
GFR calc non Af Amer: >90 mL/min (ref >90)

## 2012-05-22 LAB — POCT I-STAT 4, (NA,K, GLUC, HGB,HCT)
Glucose, Bld: 158 mg/dL — ABNORMAL HIGH (ref 70–99)
Glucose, Bld: 126 mg/dL — ABNORMAL HIGH (ref 70–99)
HCT: 33 % — ABNORMAL LOW (ref 39.0–52.0)
HCT: 27 % — ABNORMAL LOW (ref 39.0–52.0)
HCT: 31 % — ABNORMAL LOW (ref 39.0–52.0)
Hemoglobin: 11.2 g/dL — ABNORMAL LOW (ref 13.0–17.0)
Hemoglobin: 9.9 g/dL — ABNORMAL LOW (ref 13.0–17.0)
Hemoglobin: 9.2 g/dL — ABNORMAL LOW (ref 13.0–17.0)
Potassium: 4.4 mEq/L (ref 3.5–5.1)
Potassium: 4.4 mEq/L (ref 3.5–5.1)
Potassium: 4 mEq/L (ref 3.5–5.1)
Sodium: 135 mEq/L (ref 135–145)
Sodium: 140 mEq/L (ref 135–145)
Sodium: 139 mEq/L (ref 135–145)

## 2012-05-22 LAB — POCT I-STAT, CHEM 8
BUN: 10 mg/dL (ref 6–23)
Calcium, Ion: 1.12 mmol/L — ABNORMAL LOW (ref 1.13–1.30)
Glucose, Bld: 121 mg/dL — ABNORMAL HIGH (ref 70–99)
TCO2: 23 mmol/L (ref 0–100)

## 2012-05-22 LAB — HEMOGLOBIN AND HEMATOCRIT, BLOOD
HCT: 27.8 % — ABNORMAL LOW (ref 39.0–52.0)
Hemoglobin: 9.5 g/dL — ABNORMAL LOW (ref 13.0–17.0)

## 2012-05-22 LAB — APTT: aPTT: 32 seconds (ref 24–37)

## 2012-05-22 LAB — PROTIME-INR: INR: 1.32 (ref 0.00–1.49)

## 2012-05-22 LAB — CBC
HCT: 29.3 % — ABNORMAL LOW (ref 39.0–52.0)
Hemoglobin: 10.1 g/dL — ABNORMAL LOW (ref 13.0–17.0)
MCHC: 34.5 g/dL (ref 30.0–36.0)
MCV: 83.5 fL (ref 78.0–100.0)
Platelets: 158 10*3/uL (ref 150–400)
RBC: 3.61 MIL/uL — ABNORMAL LOW (ref 4.22–5.81)
RDW: 13.3 % (ref 11.5–15.5)
RDW: 13.3 % (ref 11.5–15.5)
WBC: 12.9 10*3/uL — ABNORMAL HIGH (ref 4.0–10.5)

## 2012-05-22 LAB — PLATELET COUNT: Platelets: 216 10*3/uL (ref 150–400)

## 2012-05-22 LAB — MAGNESIUM: Magnesium: 2.8 mg/dL — ABNORMAL HIGH (ref 1.5–2.5)

## 2012-05-22 SURGERY — CORONARY ARTERY BYPASS GRAFTING (CABG)
Anesthesia: General | Site: Leg Upper | Laterality: Right | Wound class: Clean

## 2012-05-22 MED ORDER — POTASSIUM CHLORIDE 10 MEQ/50ML IV SOLN: 10.0000 meq | INTRAVENOUS | Status: AC

## 2012-05-22 MED ORDER — SODIUM CHLORIDE 0.9 % IV SOLN
INTRAVENOUS | Status: DC
  Administered 2012-05-22: 20 mL/h via INTRAVENOUS
  Administered 2012-05-24: 14:00:00 via INTRAVENOUS

## 2012-05-22 MED ORDER — LACTATED RINGERS IV SOLN: 500.0000 mL | Freq: Once | INTRAVENOUS | Status: AC | PRN

## 2012-05-22 MED ORDER — FENTANYL CITRATE 0.05 MG/ML IJ SOLN
INTRAMUSCULAR | Status: DC | PRN
  Administered 2012-05-22: 400 ug via INTRAVENOUS
  Administered 2012-05-22 (×2): 250 ug via INTRAVENOUS
  Administered 2012-05-22: 100 ug via INTRAVENOUS
  Administered 2012-05-22 (×2): 250 ug via INTRAVENOUS

## 2012-05-22 MED ORDER — SODIUM CHLORIDE 0.9 % IJ SOLN: 3.0000 mL | INTRAMUSCULAR | Status: DC | PRN

## 2012-05-22 MED ORDER — METOPROLOL TARTRATE 1 MG/ML IV SOLN
2.5000 mg | INTRAVENOUS | Status: DC | PRN
  Administered 2012-05-24: 5 mg via INTRAVENOUS

## 2012-05-22 MED ORDER — MIDAZOLAM HCL 5 MG/5ML IJ SOLN
INTRAMUSCULAR | Status: DC | PRN
  Administered 2012-05-22 (×4): 2 mg via INTRAVENOUS
  Administered 2012-05-22: 5 mg via INTRAVENOUS

## 2012-05-22 MED ORDER — ALBUMIN HUMAN 5 % IV SOLN
250.0000 mL | INTRAVENOUS | Status: AC | PRN
  Administered 2012-05-22 (×2): 250 mL via INTRAVENOUS

## 2012-05-22 MED ORDER — METOPROLOL TARTRATE 12.5 MG HALF TABLET
12.5000 mg | ORAL_TABLET | Freq: Two times a day (BID) | ORAL | Status: DC
  Administered 2012-05-22: 12.5 mg via ORAL
  Filled 2012-05-22 (×3): qty 1

## 2012-05-22 MED ORDER — NITROGLYCERIN IN D5W 200-5 MCG/ML-% IV SOLN: 0.0000 ug/min | INTRAVENOUS | Status: DC

## 2012-05-22 MED ORDER — ASPIRIN EC 325 MG PO TBEC
325.0000 mg | DELAYED_RELEASE_TABLET | Freq: Every day | ORAL | Status: DC
  Administered 2012-05-23 – 2012-05-26 (×4): 325 mg via ORAL
  Filled 2012-05-22 (×4): qty 1

## 2012-05-22 MED ORDER — DEXTROSE 5 % IV SOLN
1.5000 g | Freq: Two times a day (BID) | INTRAVENOUS | Status: AC
  Administered 2012-05-22 – 2012-05-23 (×3): 1.5 g via INTRAVENOUS
  Filled 2012-05-22 (×3): qty 1.5

## 2012-05-22 MED ORDER — ACETAMINOPHEN 160 MG/5ML PO SOLN
975.0000 mg | Freq: Four times a day (QID) | ORAL | Status: DC
Start: 2012-05-23 — End: 2012-05-26
  Administered 2012-05-26 (×2): 975 mg
  Filled 2012-05-22: qty 40.6

## 2012-05-22 MED ORDER — ALBUMIN HUMAN 5 % IV SOLN
INTRAVENOUS | Status: DC | PRN
  Administered 2012-05-22: 12:00:00 via INTRAVENOUS

## 2012-05-22 MED ORDER — SODIUM CHLORIDE 0.9 % IV SOLN
INTRAVENOUS | Status: DC
  Administered 2012-05-22: via INTRAVENOUS
  Filled 2012-05-22 (×2): qty 1

## 2012-05-22 MED ORDER — ACETAMINOPHEN 10 MG/ML IV SOLN
1000.0000 mg | Freq: Once | INTRAVENOUS | Status: AC
  Administered 2012-05-22: 1000 mg via INTRAVENOUS
  Filled 2012-05-22: qty 100

## 2012-05-22 MED ORDER — METOPROLOL TARTRATE 25 MG/10 ML ORAL SUSPENSION
12.5000 mg | Freq: Two times a day (BID) | ORAL | Status: DC
  Filled 2012-05-22 (×3): qty 5

## 2012-05-22 MED ORDER — METOPROLOL TARTRATE 1 MG/ML IV SOLN: 2.5000 mg | INTRAVENOUS | Status: DC | PRN

## 2012-05-22 MED ORDER — VANCOMYCIN HCL IN DEXTROSE 1-5 GM/200ML-% IV SOLN
1000.0000 mg | Freq: Once | INTRAVENOUS | Status: AC
  Administered 2012-05-22: 1000 mg via INTRAVENOUS
  Filled 2012-05-22: qty 200

## 2012-05-22 MED ORDER — BISACODYL 10 MG RE SUPP: 10.0000 mg | Freq: Every day | RECTAL | Status: DC

## 2012-05-22 MED ORDER — HEMOSTATIC AGENTS (NO CHARGE) OPTIME
TOPICAL | Status: DC | PRN
  Administered 2012-05-22: 1 via TOPICAL

## 2012-05-22 MED ORDER — SODIUM CHLORIDE 0.45 % IV SOLN
INTRAVENOUS | Status: DC
  Administered 2012-05-22: 20 mL/h via INTRAVENOUS

## 2012-05-22 MED ORDER — DEXMEDETOMIDINE HCL IN NACL 200 MCG/50ML IV SOLN
0.1000 ug/kg/h | INTRAVENOUS | Status: DC
  Administered 2012-05-22: 0.7 ug/kg/h via INTRAVENOUS
  Filled 2012-05-22: qty 50

## 2012-05-22 MED ORDER — VECURONIUM BROMIDE 10 MG IV SOLR
INTRAVENOUS | Status: DC | PRN
  Administered 2012-05-22 (×3): 5 mg via INTRAVENOUS
  Administered 2012-05-22: 2 mg via INTRAVENOUS

## 2012-05-22 MED ORDER — MAGNESIUM SULFATE 40 MG/ML IJ SOLN
4.0000 g | Freq: Once | INTRAMUSCULAR | Status: AC
  Administered 2012-05-22: 4 g via INTRAVENOUS
  Filled 2012-05-22: qty 100

## 2012-05-22 MED ORDER — PHENYLEPHRINE HCL 10 MG/ML IJ SOLN
0.0000 ug/min | INTRAVENOUS | Status: DC
  Administered 2012-05-22: 0 ug/min via INTRAVENOUS
  Filled 2012-05-22: qty 2

## 2012-05-22 MED ORDER — ONDANSETRON HCL 4 MG/2ML IJ SOLN
4.0000 mg | Freq: Four times a day (QID) | INTRAMUSCULAR | Status: DC | PRN
  Administered 2012-05-22 – 2012-05-23 (×2): 4 mg via INTRAVENOUS
  Filled 2012-05-22 (×3): qty 2

## 2012-05-22 MED ORDER — EZETIMIBE-SIMVASTATIN 10-20 MG PO TABS
1.0000 | ORAL_TABLET | Freq: Every day | ORAL | Status: DC
  Administered 2012-05-23 – 2012-05-25 (×3): 1 via ORAL
  Filled 2012-05-22 (×7): qty 1

## 2012-05-22 MED ORDER — SODIUM CHLORIDE 0.9 % IJ SOLN
3.0000 mL | Freq: Two times a day (BID) | INTRAMUSCULAR | Status: DC
  Administered 2012-05-23 – 2012-05-25 (×5): 3 mL via INTRAVENOUS

## 2012-05-22 MED ORDER — SODIUM CHLORIDE 0.9 % IV SOLN: 250.0000 mL | INTRAVENOUS | Status: DC

## 2012-05-22 MED ORDER — 0.9 % SODIUM CHLORIDE (POUR BTL) OPTIME
TOPICAL | Status: DC | PRN
  Administered 2012-05-22: 6000 mL

## 2012-05-22 MED ORDER — DOCUSATE SODIUM 100 MG PO CAPS
200.0000 mg | ORAL_CAPSULE | Freq: Every day | ORAL | Status: DC
  Administered 2012-05-23 – 2012-05-25 (×3): 200 mg via ORAL
  Filled 2012-05-22: qty 2
  Filled 2012-05-22: qty 1
  Filled 2012-05-22 (×2): qty 2

## 2012-05-22 MED ORDER — MORPHINE SULFATE 2 MG/ML IJ SOLN
1.0000 mg | INTRAMUSCULAR | Status: AC | PRN
  Administered 2012-05-22: 2 mg via INTRAVENOUS

## 2012-05-22 MED ORDER — OXYCODONE HCL 5 MG PO TABS
5.0000 mg | ORAL_TABLET | ORAL | Status: DC | PRN
  Administered 2012-05-23 – 2012-05-25 (×11): 10 mg via ORAL
  Administered 2012-05-26: 5 mg via ORAL
  Filled 2012-05-22: qty 1
  Filled 2012-05-22 (×11): qty 2

## 2012-05-22 MED ORDER — BISACODYL 5 MG PO TBEC
10.0000 mg | DELAYED_RELEASE_TABLET | Freq: Every day | ORAL | Status: DC
  Administered 2012-05-23 – 2012-05-26 (×4): 10 mg via ORAL
  Filled 2012-05-22 (×4): qty 2

## 2012-05-22 MED ORDER — LIDOCAINE HCL (CARDIAC) 20 MG/ML IV SOLN
INTRAVENOUS | Status: DC | PRN
  Administered 2012-05-22: 75 mg via INTRAVENOUS

## 2012-05-22 MED ORDER — LACTATED RINGERS IV SOLN
INTRAVENOUS | Status: DC
  Administered 2012-05-22: 20 mL/h via INTRAVENOUS

## 2012-05-22 MED ORDER — HEPARIN SODIUM (PORCINE) 1000 UNIT/ML IJ SOLN
INTRAMUSCULAR | Status: DC | PRN
  Administered 2012-05-22: 2000 [IU] via INTRAVENOUS
  Administered 2012-05-22: 28000 [IU] via INTRAVENOUS

## 2012-05-22 MED ORDER — ACETAMINOPHEN 500 MG PO TABS
1000.0000 mg | ORAL_TABLET | Freq: Four times a day (QID) | ORAL | Status: DC
  Administered 2012-05-22 – 2012-05-26 (×12): 1000 mg via ORAL
  Filled 2012-05-22 (×18): qty 2

## 2012-05-22 MED ORDER — PROTAMINE SULFATE 10 MG/ML IV SOLN
INTRAVENOUS | Status: DC | PRN
  Administered 2012-05-22: 40 mg via INTRAVENOUS
  Administered 2012-05-22: 100 mg via INTRAVENOUS
  Administered 2012-05-22: 50 mg via INTRAVENOUS
  Administered 2012-05-22: 10 mg via INTRAVENOUS
  Administered 2012-05-22: 50 mg via INTRAVENOUS

## 2012-05-22 MED ORDER — ASPIRIN 81 MG PO CHEW: 324.0000 mg | CHEWABLE_TABLET | Freq: Every day | ORAL | Status: DC

## 2012-05-22 MED ORDER — FAMOTIDINE IN NACL 20-0.9 MG/50ML-% IV SOLN
20.0000 mg | Freq: Two times a day (BID) | INTRAVENOUS | Status: DC
  Administered 2012-05-22: 20 mg via INTRAVENOUS

## 2012-05-22 MED ORDER — PROPOFOL 10 MG/ML IV BOLUS
INTRAVENOUS | Status: DC | PRN
  Administered 2012-05-22: 150 mg via INTRAVENOUS

## 2012-05-22 MED ORDER — SODIUM CHLORIDE 0.9 % IV SOLN
INTRAVENOUS | Status: DC | PRN
  Administered 2012-05-22: 07:00:00 via INTRAVENOUS

## 2012-05-22 MED ORDER — INSULIN REGULAR BOLUS VIA INFUSION
0.0000 [IU] | Freq: Three times a day (TID) | INTRAVENOUS | Status: DC
  Filled 2012-05-22: qty 10

## 2012-05-22 MED ORDER — MIDAZOLAM HCL 2 MG/2ML IJ SOLN: 2.0000 mg | INTRAMUSCULAR | Status: DC | PRN

## 2012-05-22 MED ORDER — SODIUM CHLORIDE 0.9 % IJ SOLN
OROMUCOSAL | Status: DC | PRN
  Administered 2012-05-22 (×4): via TOPICAL

## 2012-05-22 MED ORDER — PANTOPRAZOLE SODIUM 40 MG PO TBEC
40.0000 mg | DELAYED_RELEASE_TABLET | Freq: Every day | ORAL | Status: DC
  Administered 2012-05-24 – 2012-05-26 (×3): 40 mg via ORAL
  Filled 2012-05-22 (×4): qty 1

## 2012-05-22 MED ORDER — MORPHINE SULFATE 2 MG/ML IJ SOLN
2.0000 mg | INTRAMUSCULAR | Status: DC | PRN
  Administered 2012-05-22: 2 mg via INTRAVENOUS
  Administered 2012-05-22: 4 mg via INTRAVENOUS
  Administered 2012-05-22: 2 mg via INTRAVENOUS
  Administered 2012-05-23: 4 mg via INTRAVENOUS
  Administered 2012-05-23 (×2): 2 mg via INTRAVENOUS
  Administered 2012-05-23: 4 mg via INTRAVENOUS
  Administered 2012-05-23 (×4): 2 mg via INTRAVENOUS
  Filled 2012-05-22 (×2): qty 1
  Filled 2012-05-22: qty 2
  Filled 2012-05-22: qty 1
  Filled 2012-05-22 (×2): qty 2
  Filled 2012-05-22 (×6): qty 1

## 2012-05-22 MED ORDER — ROCURONIUM BROMIDE 100 MG/10ML IV SOLN
INTRAVENOUS | Status: DC | PRN
  Administered 2012-05-22: 50 mg via INTRAVENOUS

## 2012-05-22 MED ORDER — DEXTROSE 5 % IV SOLN
1.5000 g | Freq: Two times a day (BID) | INTRAVENOUS | Status: DC
  Filled 2012-05-22 (×2): qty 1.5

## 2012-05-22 MED ORDER — LACTATED RINGERS IV SOLN
INTRAVENOUS | Status: DC | PRN
  Administered 2012-05-22: 07:00:00 via INTRAVENOUS

## 2012-05-22 SURGICAL SUPPLY — 88 items
ATTRACTOMAT 16X20 MAGNETIC DRP (DRAPES) ×3 IMPLANT
BAG DECANTER FOR FLEXI CONT (MISCELLANEOUS) ×3 IMPLANT
BANDAGE ELASTIC 4 VELCRO ST LF (GAUZE/BANDAGES/DRESSINGS) ×3 IMPLANT
BANDAGE ELASTIC 6 VELCRO ST LF (GAUZE/BANDAGES/DRESSINGS) ×3 IMPLANT
BANDAGE GAUZE ELAST BULKY 4 IN (GAUZE/BANDAGES/DRESSINGS) ×3 IMPLANT
BASKET HEART (ORDER IN 25'S) (MISCELLANEOUS) ×1
BASKET HEART (ORDER IN 25S) (MISCELLANEOUS) ×2 IMPLANT
BLADE STERNUM SYSTEM 6 (BLADE) ×3 IMPLANT
CANISTER SUCTION 2500CC (MISCELLANEOUS) ×3 IMPLANT
CATH CPB KIT HENDRICKSON (MISCELLANEOUS) ×3 IMPLANT
CATH ROBINSON RED A/P 18FR (CATHETERS) ×3 IMPLANT
CATH THORACIC 36FR (CATHETERS) ×3 IMPLANT
CATH THORACIC 36FR RT ANG (CATHETERS) ×3 IMPLANT
CLIP TI MEDIUM 24 (CLIP) IMPLANT
CLIP TI WIDE RED SMALL 24 (CLIP) ×2 IMPLANT
CLOTH BEACON ORANGE TIMEOUT ST (SAFETY) ×3 IMPLANT
COVER SURGICAL LIGHT HANDLE (MISCELLANEOUS) ×3 IMPLANT
CRADLE DONUT ADULT HEAD (MISCELLANEOUS) ×3 IMPLANT
DRAPE CARDIOVASCULAR INCISE (DRAPES) ×3
DRAPE SLUSH/WARMER DISC (DRAPES) ×3 IMPLANT
DRAPE SRG 135X102X78XABS (DRAPES) ×2 IMPLANT
DRSG COVADERM 4X14 (GAUZE/BANDAGES/DRESSINGS) ×3 IMPLANT
ELECT REM PT RETURN 9FT ADLT (ELECTROSURGICAL) ×6
ELECTRODE REM PT RTRN 9FT ADLT (ELECTROSURGICAL) ×4 IMPLANT
GLOVE BIO SURGEON STRL SZ 6 (GLOVE) IMPLANT
GLOVE BIO SURGEON STRL SZ 6.5 (GLOVE) ×5 IMPLANT
GLOVE BIO SURGEON STRL SZ7 (GLOVE) ×2 IMPLANT
GLOVE BIO SURGEON STRL SZ7.5 (GLOVE) ×3 IMPLANT
GLOVE BIOGEL PI IND STRL 6.5 (GLOVE) IMPLANT
GLOVE BIOGEL PI IND STRL 7.0 (GLOVE) IMPLANT
GLOVE BIOGEL PI IND STRL 7.5 (GLOVE) IMPLANT
GLOVE BIOGEL PI INDICATOR 6.5 (GLOVE) ×4
GLOVE BIOGEL PI INDICATOR 7.0 (GLOVE)
GLOVE BIOGEL PI INDICATOR 7.5 (GLOVE)
GLOVE EUDERMIC 7 POWDERFREE (GLOVE) ×9 IMPLANT
GOWN PREVENTION PLUS XLARGE (GOWN DISPOSABLE) ×8 IMPLANT
GOWN STRL NON-REIN LRG LVL3 (GOWN DISPOSABLE) ×13 IMPLANT
HEMOSTAT POWDER SURGIFOAM 1G (HEMOSTASIS) ×10 IMPLANT
HEMOSTAT SURGICEL 2X14 (HEMOSTASIS) ×3 IMPLANT
INSERT FOGARTY XLG (MISCELLANEOUS) IMPLANT
KIT BASIN OR (CUSTOM PROCEDURE TRAY) ×3 IMPLANT
KIT ROOM TURNOVER OR (KITS) ×3 IMPLANT
KIT SUCTION CATH 14FR (SUCTIONS) ×6 IMPLANT
KIT VASOVIEW W/TROCAR VH 2000 (KITS) ×3 IMPLANT
MARKER GRAFT CORONARY BYPASS (MISCELLANEOUS) ×9 IMPLANT
NS IRRIG 1000ML POUR BTL (IV SOLUTION) ×16 IMPLANT
PACK OPEN HEART (CUSTOM PROCEDURE TRAY) ×3 IMPLANT
PAD ARMBOARD 7.5X6 YLW CONV (MISCELLANEOUS) ×6 IMPLANT
PENCIL BUTTON HOLSTER BLD 10FT (ELECTRODE) ×3 IMPLANT
PUNCH AORTIC ROTATE 4.0MM (MISCELLANEOUS) IMPLANT
PUNCH AORTIC ROTATE 4.5MM 8IN (MISCELLANEOUS) ×1 IMPLANT
PUNCH AORTIC ROTATE 5MM 8IN (MISCELLANEOUS) IMPLANT
SET CARDIOPLEGIA MPS 5001102 (MISCELLANEOUS) ×1 IMPLANT
SPONGE GAUZE 4X4 12PLY (GAUZE/BANDAGES/DRESSINGS) ×7 IMPLANT
SUT BONE WAX W31G (SUTURE) ×3 IMPLANT
SUT MNCRL AB 4-0 PS2 18 (SUTURE) IMPLANT
SUT PROLENE 3 0 SH DA (SUTURE) ×3 IMPLANT
SUT PROLENE 4 0 RB 1 (SUTURE)
SUT PROLENE 4 0 SH DA (SUTURE) IMPLANT
SUT PROLENE 4-0 RB1 .5 CRCL 36 (SUTURE) IMPLANT
SUT PROLENE 6 0 C 1 30 (SUTURE) ×7 IMPLANT
SUT PROLENE 7 0 BV 1 (SUTURE) ×1 IMPLANT
SUT PROLENE 7 0 BV1 MDA (SUTURE) ×4 IMPLANT
SUT PROLENE 8 0 BV175 6 (SUTURE) IMPLANT
SUT SILK  1 MH (SUTURE)
SUT SILK 1 MH (SUTURE) IMPLANT
SUT STEEL 6MS V (SUTURE) IMPLANT
SUT STEEL STERNAL CCS#1 18IN (SUTURE) IMPLANT
SUT STEEL SZ 6 DBL 3X14 BALL (SUTURE) ×3 IMPLANT
SUT VIC AB 1 CTX 36 (SUTURE) ×6
SUT VIC AB 1 CTX36XBRD ANBCTR (SUTURE) ×4 IMPLANT
SUT VIC AB 2-0 CT1 27 (SUTURE) ×3
SUT VIC AB 2-0 CT1 TAPERPNT 27 (SUTURE) IMPLANT
SUT VIC AB 2-0 CTX 27 (SUTURE) IMPLANT
SUT VIC AB 3-0 SH 27 (SUTURE)
SUT VIC AB 3-0 SH 27X BRD (SUTURE) IMPLANT
SUT VIC AB 3-0 X1 27 (SUTURE) ×1 IMPLANT
SUT VICRYL 4-0 PS2 18IN ABS (SUTURE) IMPLANT
SUTURE E-PAK OPEN HEART (SUTURE) ×3 IMPLANT
SYSTEM SAHARA CHEST DRAIN ATS (WOUND CARE) ×3 IMPLANT
TAPE CLOTH SURG 4X10 WHT LF (GAUZE/BANDAGES/DRESSINGS) ×1 IMPLANT
TOWEL OR 17X24 6PK STRL BLUE (TOWEL DISPOSABLE) ×6 IMPLANT
TOWEL OR 17X26 10 PK STRL BLUE (TOWEL DISPOSABLE) ×6 IMPLANT
TRAY FOLEY IC TEMP SENS 14FR (CATHETERS) ×3 IMPLANT
TUBE FEEDING 8FR 16IN STR KANG (MISCELLANEOUS) ×3 IMPLANT
TUBING INSUFFLATION 10FT LAP (TUBING) ×3 IMPLANT
UNDERPAD 30X30 INCONTINENT (UNDERPADS AND DIAPERS) ×3 IMPLANT
WATER STERILE IRR 1000ML POUR (IV SOLUTION) ×6 IMPLANT

## 2012-05-22 NOTE — Progress Notes (Signed)
PER ALLISON , DOPPLERS WERE DONE IN AUGUST AND REVIEWED BY DR. HENDRICKSON.

## 2012-05-22 NOTE — Transfer of Care (Signed)
Immediate Anesthesia Transfer of Care Note  Patient: Colin Parks  Procedure(s) Performed: Procedure(s) (LRB) with comments: CORONARY ARTERY BYPASS GRAFTING (CABG) (N/A) - times five, using Left Internal Mammary Artery and Right Greater Saphenous Vein Graft harvested Endoscopically ENDOVEIN HARVEST OF GREATER SAPHENOUS VEIN (Right)  Patient Location: SICU  Anesthesia Type: General  Level of Consciousness: unresponsive and Patient remains intubated per anesthesia plan  Airway & Oxygen Therapy: Patient remains intubated per anesthesia plan and Patient placed on Ventilator (see vital sign flow sheet for setting)  Post-op Assessment: Report given to PACU RN and Post -op Vital signs reviewed and stable  Post vital signs: Reviewed and stable  Complications: No apparent anesthesia complications

## 2012-05-22 NOTE — Preoperative (Signed)
Beta Blockers   Reason not to administer Beta Blockers:Not Applicable 

## 2012-05-22 NOTE — Anesthesia Postprocedure Evaluation (Signed)
  Anesthesia Post-op Note  Patient: Colin Parks  Procedure(s) Performed: Procedure(s) (LRB) with comments: CORONARY ARTERY BYPASS GRAFTING (CABG) (N/A) - times five, using Left Internal Mammary Artery and Right Greater Saphenous Vein Graft harvested Endoscopically ENDOVEIN HARVEST OF GREATER SAPHENOUS VEIN (Right)  Patient Location: SICU  Anesthesia Type: General  Level of Consciousness: sedated and Patient remains intubated per anesthesia plan  Airway and Oxygen Therapy: Patient remains intubated per anesthesia plan and Patient placed on Ventilator (see vital sign flow sheet for setting)  Post-op Pain: none  Post-op Assessment: Post-op Vital signs reviewed and Patient's Cardiovascular Status Stable  Post-op Vital Signs: stable  Complications: No apparent anesthesia complications

## 2012-05-22 NOTE — Interval H&P Note (Signed)
History and Physical Interval Note:  05/22/2012 7:26 AM  Colin Parks  has presented today for surgery, with the diagnosis of CAD  The various methods of treatment have been discussed with the patient and family. After consideration of risks, benefits and other options for treatment, the patient has consented to  Procedure(s) (LRB) with comments: CORONARY ARTERY BYPASS GRAFTING (CABG) (N/A) - x5, EVH as a surgical intervention .  The patient's history has been reviewed, patient examined, no change in status, stable for surgery.  I have reviewed the patient's chart and labs.  Questions were answered to the patient's satisfaction.     HENDRICKSON,STEVEN C

## 2012-05-22 NOTE — Anesthesia Procedure Notes (Signed)
Procedures Procedures: Right IJ Colin Parks Catheter Insertion: [id number]: The patient was identified and consent obtained.  TO was performed, and full barrier precautions were used.  The skin was anesthetized with lidocaine-4cc plain with 25g needle.  Once the vein was located with the 22 ga. needle using ultrasound guidance , the wire was inserted into the vein.  The wire location was confirmed with ultrasound.  The tissue was dilated and the 8.5 [place] cordis catheter was carefully inserted. Afterwards Colin Parks catheter was inserted. PA catheter at 47cm.  The patient tolerated the procedure well.   CE

## 2012-05-22 NOTE — Progress Notes (Signed)
Patient ID: Colin Parks, male   DOB: 1950/10/14, 61 y.o.   MRN: [id number]   SICU evening rounds:  Hemodynamically stable  CI = 2.1  Weaning vent but still sleepy  CT output low  Urine output adequate.  CBC    Component Value Date/Time   WBC 14.2* 05/22/2012 1330   RBC 3.51* 05/22/2012 1330   HGB 10.1* 05/22/2012 1330   HCT 29.3* 05/22/2012 1330   PLT 152 05/22/2012 1330   MCV 83.5 05/22/2012 1330   MCH 28.8 05/22/2012 1330   MCHC 34.5 05/22/2012 1330   RDW 13.3 05/22/2012 1330    BMET    Component Value Date/Time   NA 141 05/22/2012 1314   K 4.0 05/22/2012 1314   CL 99 05/20/2012 0900   CO2 20 05/20/2012 0900   GLUCOSE 126* 05/22/2012 1314   BUN 15 05/20/2012 0900   CREATININE 0.74 05/20/2012 0900   CALCIUM 9.6 05/20/2012 0900   GFRNONAA >90 05/20/2012 0900   GFRAA >90 05/20/2012 0900    A/P: stable postop. Extubate when ready.

## 2012-05-22 NOTE — Procedures (Signed)
Extubation Procedure Note  Patient Details:   Name: Colin Parks DOB: 20-Aug-1950 MRN: [id number]   Airway Documentation:     Evaluation  O2 sats: stable throughout and currently acceptable Complications: No apparent complications Patient did tolerate procedure well. Bilateral Breath Sounds: Clear   Yes Pt awake and alert. Extubated per protocol. Placed on 3L [place], sat 98%. Positive cuff leak, NIF-20, VC 1200, IS 1250, BBS cl. Pt able to vocalize.  Arloa Koh 05/22/2012, 5:32 PM

## 2012-05-22 NOTE — Anesthesia Preprocedure Evaluation (Addendum)
Anesthesia Evaluation  Patient identified by MRN, date of birth, ID band Patient awake    Reviewed: Allergy & Precautions, H&P , NPO status , Patient's Chart, lab work & pertinent test results  Airway Mallampati: II      Dental  (+) Teeth Intact and Dental Advisory Given   Pulmonary  breath sounds clear to auscultation        Cardiovascular Rhythm:Regular Rate:Normal     Neuro/Psych    GI/Hepatic   Endo/Other    Renal/GU      Musculoskeletal   Abdominal   Peds  Hematology   Anesthesia Other Findings   Reproductive/Obstetrics                           Anesthesia Physical Anesthesia Plan  ASA: III  Anesthesia Plan: General   Post-op Pain Management:    Induction: Intravenous  Airway Management Planned: Oral ETT  Additional Equipment: Arterial line, 3D TEE, PA Cath and CVP  Intra-op Plan:   Post-operative Plan: Post-operative intubation/ventilation  Informed Consent: I have reviewed the patients History and Physical, chart, labs and discussed the procedure including the risks, benefits and alternatives for the proposed anesthesia with the patient or authorized representative who has indicated his/her understanding and acceptance.   Dental advisory given  Plan Discussed with:   Anesthesia Plan Comments: (3V CAD with positive stress test normal LV systolic function Type 2 DM Htn S/P bilat carotid endarectomies S/p CVA 2006 mild L. Sided weakness  Plan GA  Kipp Brood, MD)       Anesthesia Quick Evaluation

## 2012-05-22 NOTE — Plan of Care (Signed)
Problem: Phase II Progression Outcomes Goal: Patient extubated within - Outcome: Completed/Met Date Met:  05/22/12 6hrs

## 2012-05-22 NOTE — H&P (View-Only) (Signed)
PCP is HUSAIN,KARRAR, MD Referring Provider is Skains, Mark, MD  Chief Complaint  Patient presents with  . Coronary Artery Disease    Referral from Dr Skains for surgical eval on CAD, Cardiac Cath on 05/14/12    HPI: 61-year-old male with a history of atherosclerotic cardiovascular disease who present with chief complaint of chest pain.  Colin Parks is a 61-year-old gentleman with a history of diabetes, hypertension, dyslipidemia, and peripheral arterial disease. He had a stroke with mild right-sided weakness in 2006 and underwent carotid endarterectomy on the left and 2006 and on the right and 2009. He also had a subclavian stenosis which required a stent in 2012. He had no history of cardiac disease until recently. About a month ago he was out working in his yard and experienced chest tightness. This was midsternal and was relieved with rest. He then started working again and the pain recurred and was accompanied by a hot flash and diaphoresis. He went inside and his symptoms resolved. He then saw Dr. Husain who recommended a stress test. It showed possible ischemia in the inferior basal/mid inferior wall distribution. He has a normal ejection fraction. He was referred to Dr. scanning screw recommend a cardiac catheterization. That was done on 05/14/2012. It revealed severe three-vessel coronary disease with normal left ventricular function. He has not had any more episodes of chest tightness, but he does complain of some transient left-sided chest discomfort usually lasting only 2-3 minutes.   Past Medical History  Diagnosis Date  . Hypertension   . Diabetes mellitus   . Hyperlipidemia   . Arthritis   . PVD (peripheral vascular disease)   . Stroke 2006    Past Surgical History  Procedure Date  . Angioplasty 05/10/2005    Dacron patch  . Cardiac catheterization 02/06/2010    Aortic Arch - Second order catheterization (right subclavian artery)  . Carotid stent 02/06/2010    right  subclavian artery  . Hand surgery right hand cyst removed from knuckle per pt.    2008  . Knee surgery left knee 2004  . Ankle surgery left ankle 2002  . Carotid endarterectomy 05/10/2005    Left  . Carotid endarterectomy 2009    Dr. Early    Family History  Problem Relation Age of Onset  . Heart disease Mother   . Heart disease Father   . Hypertension Mother     Social History History  Substance Use Topics  . Smoking status: Former Smoker    Quit date: 08/07/1971  . Smokeless tobacco: Not on file  . Alcohol Use: Yes     one beer once a month    Current Outpatient Prescriptions  Medication Sig Dispense Refill  . aspirin 81 MG tablet Take 81 mg by mouth daily.        . desoximetasone (TOPICORT) 0.25 % cream Apply 0.25 % topically as needed. Rash on legs      . ezetimibe-simvastatin (VYTORIN) 10-20 MG per tablet Take 1 tablet by mouth at bedtime.        . fexofenadine (ALLEGRA) 180 MG tablet Take 180 mg by mouth daily.        . Glucosamine-Chondroit-Vit C-Mn (GLUCOSAMINE CHONDR 1500 COMPLX PO) Take 1 capsule by mouth.      . lansoprazole (PREVACID) 15 MG capsule Take 15 mg by mouth daily.        . metFORMIN (GLUMETZA) 500 MG (MOD) 24 hr tablet Take 500 mg by mouth 2 (two) times daily with a   meal.        . Multiple Vitamins-Minerals (GNP MEGA MULTI FOR MEN PO) Take by mouth 2 (two) times daily.        . ramipril (ALTACE) 10 MG tablet Take 10 mg by mouth daily.        . sitaGLIPtin (JANUVIA) 100 MG tablet Take 100 mg by mouth daily.          Allergies  Allergen Reactions  . Codeine Nausea And Vomiting    Review of Systems  Constitutional: Negative for activity change, appetite change and unexpected weight change.  Eyes:       Glasses  Respiratory: Negative.   Cardiovascular: Positive for chest pain. Negative for leg swelling.       CEA bilateral, right subclavian stent, legs feel tired late in day  Genitourinary: Negative.   Musculoskeletal: Positive for joint  swelling and arthralgias.  Neurological: Negative for dizziness, syncope, speech difficulty and weakness.       CVA in 2007- no residual, but has to be careful going down stairs  Hematological: Does not bruise/bleed easily.  All other systems reviewed and are negative.    BP 136/94  Pulse 88  Resp 18  Ht 6' (1.829 m)  Wt 218 lb (98.884 kg)  BMI 29.57 kg/m2  SpO2 95% Physical Exam  Vitals reviewed. Constitutional: He is oriented to person, place, and time. He appears well-developed and well-nourished. No distress.  HENT:  Head: Normocephalic and atraumatic.  Eyes: EOM are normal. Pupils are equal, round, and reactive to light.  Neck: No thyromegaly present.       Well healed surgical scars, no bruits  Cardiovascular: Normal rate, regular rhythm and normal heart sounds.   No murmur heard. Pulmonary/Chest: Effort normal and breath sounds normal. He has no wheezes. He has no rales.  Abdominal: Soft. There is no tenderness.  Musculoskeletal: He exhibits no edema.  Lymphadenopathy:    He has no cervical adenopathy.  Neurological: He is alert and oriented to person, place, and time. No cranial nerve deficit.  Skin: Skin is warm and dry.     Diagnostic Tests: Cardiac catheterization 05/14/2012 CARDIAC CATHETERIZATION  PROCEDURE: Left heart catheterization with selective coronary angiography, left ventriculogram, aortic arch angiogram  INDICATIONS: 61-year-old male with significant peripheral vascular disease with previous CVA in 2006 with bilateral carotid endarterectomies in 2006 and 2009 followed by Dr. Early, right subclavian stenosis status post 2 stents placed in 01/2010 by Dr. Brabham who has been experiencing chest tightness with exertion while doing yard work relieved with rest consistent with angina. Basal inferior/mid inferior/inferolateral wall ischemia noted on stress test with normal EF. Recent evaluation of subclavian stent revealed patent stent. I reviewed carefully the  procedure and 2 stents were placed because of migration of the first stent. Because of this, femoral approach was taken.  The risks, benefits, and details of the procedure were explained to the patient. The patient verbalized understanding and wanted to proceed. Informed written consent was obtained.  PROCEDURE TECHNIQUE: After Xylocaine anesthesia and visualization of the femoral head via fluoroscopy, a 5F sheath was placed in the right femoral artery with a single anterior needle wall stick. Left coronary angiography was done using a Judkins L4 catheter. Right coronary angiography was done using a Judkins R4 catheter. Left ventriculography was done using a pigtail catheter. Aortic angiogram was performed using pigtail and power injection. I attempted to selectively cannulate the left subclavian artery using guidewire and LIMA catheter but after several attempts, this was   not successful. When withdrawing blood from the catheter, plaque was noted. A single attempt was made with Judkins right catheter once again unsuccessful at selective cannulization. He remained asymptomatic.  CONTRAST: Total of 110 ml.  COMPLICATIONS: None.  HEMODYNAMICS: Aortic pressure was 125/67 mmHg; LV systolic pressure was 125 mmHg; LVEDP 5mmHg. There was no gradient between the left ventricle and aorta.  ANGIOGRAPHIC DATA:  Left main: Patent, branches into circumflex as well as left anterior descending artery. No angiographically significant disease  Left anterior descending (LAD): Severe disease beginning in the proximal segment of the LAD with approximately 80% stenosis just prior to the bifurcation of a large diagonal branch. The remainder of the LAD, is diffusely diseased with lesions of up to 90% severity throughout its mid segment. The LAD is small in caliber and traverses to the apex. The first diagonal branch has approximately 99% stenosis proximally then bifurcates. The second diagonal branch also has 90% mid stenosis just  after a mid vessel bifurcation.  Circumflex artery (CIRC): The circumflex artery is occluded just after the bifurcation of a moderate-sized first obtuse marginal branch. There are left to left collaterals slowly filling the remainder of the AV groove circumflex as well as second obtuse marginal branch.  Right coronary artery (RCA): There is proximal 30% stenosis, in the posterior descending artery, this vessel begins at its ostium is a moderate-sized vessel and has significant stenosis especially in its midportion of up to 90%.  LEFT VENTRICULOGRAM: Left ventricular angiogram was done in the 30 RAO projection and revealed normal left ventricular wall motion and systolic function with an estimated ejection fraction of 65 %.  IMPRESSIONS:  1. Severe three-vessel coronary artery disease as described above, significant disease in the proximal/mid LAD, first and second diagonal, occluded mid circumflex with left to left collaterals, significant disease in mid PDA. 2. Normal left ventricular systolic function. LVEDP 5 mmHg. Ejection fraction 65 %. 3. Normal-appearing aortic arch. Previously placed right subclavian stents noted.  RECOMMENDATION: Referral to cardiothoracic surgery for bypass. He is currently chest pain-free. I will monitor him and if stable, will allow him to go home with close followup. I described the findings to his wife and he and if any worrisome symptoms develop, he knows to seek medical attention immediately. Nitroglycerin will be prescribed.  I will ask him to stop his Plavix (10/17) in anticipation for bypass surgery. Continue with aspirin.  Carotid duplex 03/13/2012 Impression: 1. Patent carotid endarterectomy sites bilaterally  2. Right external carotid stenosis  Vascular lab exam 03/13/2012 Impression: 1. Patent right proximal subclavian artery stent 2. Elevated velocities noted proximal to and distal to stent, limited visualization. 3. Remainder of right upper extremity  arterial system appears patent   Impression:  61-year-old gentleman with multiple cardiac risk factors and known peripheral arterial disease, who is now found to have three-vessel coronary artery disease. He does have preserved left ventricular function. His disease is too extensive for stenting. Coronary artery bypass grafting is indicated for survival benefit as well as relief of symptoms.  I discussed in detail with the patient and his wife the nature of coronary bypass grafting including the need for general anesthesia, incisions be used, use of cardiopulmonary bypass, expected hospital stay, and overall recovery. He is not a candidate for bilateral mammary arteries due to his diabetes. He has an abnormal Allen's test on the left which precludes the use of the left radial artery.  I discussed with the patient and his wife the indications, risks, benefits, and   alternative treatments. They understand the risk of coronary bypass grafting include but are not limited to death, stroke, MI, DVT, PE, bleeding, possible need for transfusion, infection, cardiac arrhythmias, and other organ system dysfunction including respiratory, renal, or gastrointestinal complications.  He accepts these risks and wishes to proceed with surgery as soon as possible.  Plan: Coronary bypass grafting on Friday, 05/22/2012. He will be admitted on the day of surgery.  He had been on Plavix but that was stopped on 05/14/2012, so that will not be an issue with the surgery. 

## 2012-05-22 NOTE — Brief Op Note (Addendum)
                   [address]            [place] [address]          [phone]    05/22/2012  11:26 AM  PATIENT:  Colin Parks  61 y.o. male  PRE-OPERATIVE DIAGNOSIS:  CAD  POST-OPERATIVE DIAGNOSIS:  Coronary Artery Diesease   PROCEDURE:  Procedure(s): CORONARY ARTERY BYPASS GRAFTING (CABG)X5 LIMA-LAD; SEQ SVG-OM2-PD; SEQSVG-DIAG 1-DIAG2 EVH RIGHT LEG  SURGEON:  Surgeon(s): Loreli Slot, MD  PHYSICIAN ASSISTANT: WAYNE GOLD PA-C  ANESTHESIA:   general  PATIENT CONDITION:  ICU - intubated and hemodynamically stable.  PRE-OPERATIVE WEIGHT: 98kg   COMPLICATION: NO KNOWN  XC= 88 min CPB= 121 min  Targets small fair quality, LAD intramyocardial Good conduits

## 2012-05-22 NOTE — OR Nursing (Addendum)
Time out performed, leg incision made 0835 by Gershon Crane PA Second Time out performed prior to Chest incision by Dr Dorris Fetch @ [phone]

## 2012-05-23 ENCOUNTER — Inpatient Hospital Stay (HOSPITAL_COMMUNITY): Payer: 59

## 2012-05-23 LAB — BASIC METABOLIC PANEL
GFR calc Af Amer: >90 mL/min (ref >90)
GFR calc non Af Amer: >90 mL/min (ref >90)
Potassium: 4.1 mEq/L (ref 3.5–5.1)
Sodium: 135 mEq/L (ref 135–145)

## 2012-05-23 LAB — MAGNESIUM
Magnesium: 2.4 mg/dL (ref 1.5–2.5)
Magnesium: 2.2 mg/dL (ref 1.5–2.5)

## 2012-05-23 LAB — POCT I-STAT, CHEM 8
BUN: 9 mg/dL (ref 6–23)
Chloride: 97 mEq/L (ref 96–112)
Potassium: 4.1 mEq/L (ref 3.5–5.1)
Sodium: 137 mEq/L (ref 135–145)

## 2012-05-23 LAB — GLUCOSE, CAPILLARY
Glucose-Capillary: 101 mg/dL — ABNORMAL HIGH (ref 70–99)
Glucose-Capillary: 97 mg/dL (ref 70–99)
Glucose-Capillary: 107 mg/dL — ABNORMAL HIGH (ref 70–99)
Glucose-Capillary: 102 mg/dL — ABNORMAL HIGH (ref 70–99)
Glucose-Capillary: 130 mg/dL — ABNORMAL HIGH (ref 70–99)
Glucose-Capillary: 197 mg/dL — ABNORMAL HIGH (ref 70–99)

## 2012-05-23 LAB — CBC
Hemoglobin: 10.4 g/dL — ABNORMAL LOW (ref 13.0–17.0)
MCHC: 34.6 g/dL (ref 30.0–36.0)
Platelets: 179 10*3/uL (ref 150–400)
Platelets: 216 10*3/uL (ref 150–400)
RBC: 3.84 MIL/uL — ABNORMAL LOW (ref 4.22–5.81)
RDW: 13.6 % (ref 11.5–15.5)
RDW: 13.9 % (ref 11.5–15.5)
WBC: 18.1 10*3/uL — ABNORMAL HIGH (ref 4.0–10.5)

## 2012-05-23 LAB — CREATININE, SERUM
Creatinine, Ser: 0.76 mg/dL (ref 0.50–1.35)
GFR calc Af Amer: >90 mL/min (ref >90)
GFR calc non Af Amer: >90 mL/min (ref >90)

## 2012-05-23 MED ORDER — POTASSIUM CHLORIDE CRYS ER 20 MEQ PO TBCR
20.0000 meq | EXTENDED_RELEASE_TABLET | Freq: Two times a day (BID) | ORAL | Status: AC
  Administered 2012-05-23 (×2): 20 meq via ORAL
  Filled 2012-05-23 (×2): qty 1

## 2012-05-23 MED ORDER — METOPROLOL TARTRATE 25 MG PO TABS
25.0000 mg | ORAL_TABLET | Freq: Two times a day (BID) | ORAL | Status: DC
  Administered 2012-05-23 – 2012-05-24 (×4): 25 mg via ORAL
  Filled 2012-05-23 (×6): qty 1

## 2012-05-23 MED ORDER — FUROSEMIDE 10 MG/ML IJ SOLN
40.0000 mg | Freq: Two times a day (BID) | INTRAMUSCULAR | Status: AC
  Administered 2012-05-23 (×2): 40 mg via INTRAVENOUS
  Filled 2012-05-23 (×2): qty 4

## 2012-05-23 MED ORDER — INSULIN ASPART 100 UNIT/ML ~~LOC~~ SOLN
0.0000 [IU] | SUBCUTANEOUS | Status: DC
  Administered 2012-05-23 (×2): 2 [IU] via SUBCUTANEOUS
  Administered 2012-05-23: 4 [IU] via SUBCUTANEOUS
  Administered 2012-05-24 (×2): 8 [IU] via SUBCUTANEOUS
  Administered 2012-05-24 (×2): 2 [IU] via SUBCUTANEOUS
  Administered 2012-05-24: 4 [IU] via SUBCUTANEOUS
  Administered 2012-05-24: 2 [IU] via SUBCUTANEOUS

## 2012-05-23 MED ORDER — METOPROLOL TARTRATE 25 MG/10 ML ORAL SUSPENSION
12.5000 mg | Freq: Two times a day (BID) | ORAL | Status: DC
  Filled 2012-05-23 (×6): qty 5

## 2012-05-23 MED ORDER — METOCLOPRAMIDE HCL 5 MG/ML IJ SOLN
10.0000 mg | Freq: Four times a day (QID) | INTRAMUSCULAR | Status: AC
  Administered 2012-05-23 – 2012-05-24 (×4): 10 mg via INTRAVENOUS
  Filled 2012-05-23 (×4): qty 2

## 2012-05-23 MED ORDER — INSULIN GLARGINE 100 UNIT/ML ~~LOC~~ SOLN
30.0000 [IU] | Freq: Every day | SUBCUTANEOUS | Status: DC
  Administered 2012-05-23 – 2012-05-25 (×3): 30 [IU] via SUBCUTANEOUS

## 2012-05-23 MED ORDER — ENOXAPARIN SODIUM 40 MG/0.4ML ~~LOC~~ SOLN
40.0000 mg | SUBCUTANEOUS | Status: DC
  Administered 2012-05-23 – 2012-05-26 (×4): 40 mg via SUBCUTANEOUS
  Filled 2012-05-23 (×4): qty 0.4

## 2012-05-23 NOTE — Progress Notes (Signed)
Anesthesiology Follow-up:  Alert, neuro intact, complaining of chest soreness.  VS: T-37.6 BP-102/53 HR-95 (SR) RR-15 O2 Sat 97% on 3L  Glucose 178 K-4.4 Cr.- 0.90 H/H 10.5/31.0 Plts- 152,000   Extubated 4 1/2 hours post-op.   Stable post-op course so far.  Kipp Brood, MD

## 2012-05-23 NOTE — Addendum Note (Signed)
Addendum  created 05/23/12 1049 by Kipp Brood, MD   Modules edited:Notes Section

## 2012-05-23 NOTE — Progress Notes (Signed)
Patient ID: Colin Parks, male   DOB: January 17, 1951, 61 y.o.   MRN: [id number]    Hemodynamically stable  Good urine output  BMET    Component Value Date/Time   NA 137 05/23/2012 1716   K 4.1 05/23/2012 1716   CL 97 05/23/2012 1716   CO2 23 05/23/2012 0403   GLUCOSE 158* 05/23/2012 1716   BUN 9 05/23/2012 1716   CREATININE 0.90 05/23/2012 1716   CALCIUM 8.0* 05/23/2012 0403   GFRNONAA >90 05/23/2012 1715   GFRAA >90 05/23/2012 1715    CBC    Component Value Date/Time   WBC 18.1* 05/23/2012 1715   RBC 3.84* 05/23/2012 1715   HGB 11.2* 05/23/2012 1716   HCT 33.0* 05/23/2012 1716   PLT 216 05/23/2012 1715   MCV 83.9 05/23/2012 1715   MCH 27.9 05/23/2012 1715   MCHC 33.2 05/23/2012 1715   RDW 13.9 05/23/2012 1715    Nausea today. Will try some Reglan.

## 2012-05-23 NOTE — Op Note (Signed)
NAME:  CHALMER, ZHENG NO.:  [id number]  MEDICAL RECORD NO.:  [id number]  LOCATION:  2303                         FACILITY:  [facility]  PHYSICIAN:  Salvatore Decent. Dorris Fetch, M.D.DATE OF BIRTH:  1951-05-09  DATE OF PROCEDURE:  05/22/2012 DATE OF DISCHARGE:                              OPERATIVE REPORT   PREOPERATIVE DIAGNOSIS:  Severe three-vessel coronary artery disease with new onset angina.  POSTOPERATIVE DIAGNOSIS:  Severe three-vessel coronary artery disease with new onset angina.  PROCEDURE:  Median sternotomy, extracorporeal circulation, coronary artery bypass grafting x5 (left internal mammary artery to left anterior descending, sequential saphenous vein graft to diagonal 1 and diagonal 2, sequential saphenous vein graft to obtuse marginal 2 and distal posterior descending), endoscopic vein harvest, right leg.  SURGEON:  Salvatore Decent. Dorris Fetch, M.D.  ASSISTANT:  Rowe Clack, P.A.-C.  ANESTHESIA:  General.  FINDINGS:  Small fair quality target vessels, LAD accepted a 1.5 mm probe remaining vessels accepted only 1 mm probe.  LAD, intramyocardial, saphenous vein and mammary artery of good quality.  CLINICAL NOTE:  Mr. Juhnke is a 61 year old gentleman with multiple cardiac risk factors who was working in his yard about a month ago when he had an episode of chest tightness.  This led to an evaluation by Dr. Donette Larry, which included a stress test, which was positive.  He then was seen by Dr. Anne Fu and underwent cardiac catheterization.  He had severe three-vessel coronary disease with preserved left ventricular function. Coronary artery bypass grafting was advised for survival benefit as well as relief of symptoms.  I discussed in detail with him the indications, risks, benefits, and alternatives.  He understood and accepted the risks of surgery and agreed to proceed.  OPERATIVE NOTE:  Mr. Fusselman was brought to the preop holding area on May 22, 2012.  There  Anesthesia placed a Swan-Ganz catheter and an arterial blood pressure monitoring line.  He was taken to the operating room, anesthetized, and intubated.  Intravenous antibiotics were administered.  A Foley catheter was placed.  The chest, abdomen, and legs were prepped and draped in usual sterile fashion.  An incision was made in the medial aspect of the right leg at the level of the knee.  The greater saphenous vein was identified and was harvested endoscopically from the thigh to the upper calf.  It was a good-quality vessel.  Simultaneously, a median sternotomy was performed and the left internal mammary artery was harvested using standard technique.  The mammary artery was a 2 mm good quality vessel.  2000 units of heparin was administered during the vessel harvest.  There was excellent flow through the distal end of the mammary artery when divided.  After harvesting the conduits, remainder of the full heparin dose was given.  The pericardium was opened.  The ascending aorta was inspected. There was no atherosclerotic disease.  The aorta was cannulated via concentric 2-0 Ethibond pledgeted pursestring sutures.  A dual-stage venous cannula was placed via pursestring suture in the right atrial appendage.  Cardiopulmonary bypass was instituted.  The patient was cooled to 32 degrees Celsius.  Flows were maintained per protocol. Anticoagulation was monitored with ACT measurement.  The  coronary arteries were inspected and anastomotic sites were chosen.  The conduits were inspected and cut to length.  A foam pad was placed the pericardium to insulate the heart and protect the left phrenic nerve.  A temperature probe was placed in myocardial septum and a cardioplegic cannula was placed in the ascending aorta.  The aorta was crossclamped.  The left ventricle was emptied via the aortic root vent.  Cardiac arrest then was achieved with a combination of cold antegrade blood cardioplegia and  topical iced saline.  1 L of cardioplegia was administered.  There was a rapid diastolic arrest and myocardial septal cooling to 10 degrees Celsius.  The following distal anastomoses were performed.  First, a reversed saphenous vein graft was placed sequentially to obtuse marginal 2 and the distal posterior descending branch of the right coronary.  The OM 2 was totally occluded proximally and filled via left- to-left collaterals.  It was a fair quality vessel and accepted a 1 mm probe.  A side-to-side anastomosis was performed with a running 7-0 Prolene suture.  All anastomoses were probed proximally and distally at completion to ensure patency.  The graft flushed easily through this anastomosis.  The distal end was then beveled and anastomosed end-to- side to the distal posterior descending. The proximal posterior descending was very heavily diseased.  The distal vessel was small, accepting a 1 mm probe.  It was fair quality.  End-to-side anastomosis was performed with a running 7-0 Prolene suture.  Cardioplegia was administered through the graft.  There was good flow as well as good hemostasis at both anastomoses.  Next, a reversed saphenous vein graft was placed sequentially to the 1st and 2nd diagonals.  The first diagonal was a high anterolateral branch. The 2nd diagonal was grafted very distally due to diffuse disease in the proximal portion of the vessel.  Both of these were bifurcating vessels and both were grafted on the larger branch of the bifurcation.  Side-to- side anastomosis was performed to the first diagonal and end-to-side to the second diagonal, both with running 7-0 Prolene sutures.  There was excellent flow through the graft and good hemostasis with cardioplegia administration.  Next, the left internal mammary artery was brought through a window in the pericardium.  The distal end was beveled.  It was then anastomosed end-to-side to the LAD.  The LAD did accept a  1.5 mm probe, it was an intramyocardial vessel.  The mammary was a 2 mm good quality conduit, it was anastomosed end-to-side with a running 8-0 Prolene suture.  At the completion of the mammary to LAD anastomosis, the bulldog clamp was removed.  Immediate and rapid septal rewarming was noted. There was good hemostasis at the anastomosis.  The bulldog clamp was replaced.  The mammary pedicle was tacked to the epicardial surface of the heart with 6-0 Prolene sutures.  Additional cardioplegia was administered.  The vein grafts were cut to length.  The proximal vein graft anastomoses were performed to 4.5 mm punch aortotomies with running 6-0 Prolene sutures.  At the completion of the final proximal anastomosis, the patient was placed in Trendelenburg position.  Lidocaine was administered.  The bulldog clamp was again removed from the left mammary artery.  The aortic root was de- aired and the aortic crossclamp was removed.  Total crossclamp time was 88 minutes.  The patient required a single defibrillation with 20 joules.  He was in sinus rhythm thereafter.  While rewarming was completed, all proximal and distal anastomoses were  inspected for hemostasis.  Epicardial pacing wires were placed on the right ventricle and right atrium.  When the patient had rewarmed to a core temperature of 37 degrees Celsius, he was weaned from cardiopulmonary bypass on the first attempt without inotropic support.  He was in sinus rhythm at 80 beats per minute.  The total bypass time was 121 minutes.  The initial cardiac index was greater than 2 liters/minute/meter squared.  A test dose of protamine was administered and was well tolerated.  The atrial and aortic cannulae were removed.  The remainder of the protamine was administered without incident.  The chest was irrigated with warm saline.  Hemostasis was achieved.  The pericardium was reapproximated with interrupted 3-0 silk sutures.  It came together  easily without tension or kinking the underlying grafts.  A left pleural and single mediastinal chest tube were placed in separate subcostal incisions, secured with #1 silk sutures.  The sternum was closed with interrupted heavy gauge stainless steel wires.  The pectoralis fascia, subcutaneous tissue, and skin were closed in standard fashion.  All sponge, needle, and instrument counts were correct at the end of the procedure.  The patient was taken from the operating room to the surgical intensive care unit in good condition.     Salvatore Decent Dorris Fetch, M.D.     SCH/MEDQ  D:  05/22/2012  T:  05/23/2012  Job:  [id number]

## 2012-05-23 NOTE — Progress Notes (Signed)
1 Day Post-Op Procedure(s) (LRB): CORONARY ARTERY BYPASS GRAFTING (CABG) (N/A) ENDOVEIN HARVEST OF GREATER SAPHENOUS VEIN (Right) Subjective: Sore  Objective: Vital signs in last 24 hours: Temp:  [97 F (36.1 C)-100 F (37.8 C)] 99.5 F (37.5 C) (10/26 0820) Pulse Rate:  [73-113] 102  (10/26 0820) Cardiac Rhythm:  [-] Sinus tachycardia (10/26 0730) Resp:  [11-25] 25  (10/26 0820) BP: (99-149)/(50-98) 118/98 mmHg (10/26 0800) SpO2:  [96 %-100 %] 98 % (10/26 0820) Arterial Line BP: (99-165)/(48-70) 161/61 mmHg (10/26 0820) FiO2 (%):  [39.8 %-50.3 %] 40.1 % (10/25 1715) Weight:  [100.4 kg (221 lb 5.5 oz)] 100.4 kg (221 lb 5.5 oz) (10/26 0600)  Hemodynamic parameters for last 24 hours: PAP: (21-33)/(10-20) 30/13 mmHg CO:  [4.8 L/min-7.7 L/min] 5.9 L/min CI:  [2.2 L/min/m2-3.5 L/min/m2] 2.7 L/min/m2  Intake/Output from previous day: 10/25 0701 - 10/26 0700 In: 5775.4 [P.O.:210; I.V.:4206.4; Blood:427; NG/GT:30; IV Piggyback:902] Out: 4298 [Urine:3255; Emesis/NG output:5; Blood:650; Chest Tube:388] Intake/Output this shift:    General appearance: alert and cooperative Neurologic: intact Heart: regular rate and rhythm, S1, S2 normal, no murmur, click, rub or gallop Lungs: clear to auscultation bilaterally Extremities: edema mild Wound: dressing dry  Lab Results:  Basename 05/23/12 0403 05/22/12 1900  WBC 15.3* 12.9*  HGB 10.4* 10.4*9.9*  HCT 30.1* 30.0*29.0*  PLT 179 158   BMET:  Basename 05/23/12 0403 05/22/12 1900  NA 135 140  K 4.1 4.2  CL 102 105  CO2 23 --  GLUCOSE 106* 121*  BUN 9 10  CREATININE 0.62 0.650.90  CALCIUM 8.0* --    PT/INR:  Basename 05/22/12 1330  LABPROT 16.1*  INR 1.32   ABG    Component Value Date/Time   PHART 7.354 05/22/2012 1913   HCO3 26.2* 05/22/2012 1913   TCO2 28 05/22/2012 1913   ACIDBASEDEF 1.0 05/22/2012 1717   O2SAT 99.0 05/22/2012 1913   CBG (last 3)   Basename 05/23/12 0655 05/23/12 0559 05/23/12 0506  GLUCAP  99 107* 97   CXR:  Left base atelectasis  ECG:  NSR, nonspecific changes  Assessment/Plan: S/P Procedure(s) (LRB): CORONARY ARTERY BYPASS GRAFTING (CABG) (N/A) ENDOVEIN HARVEST OF GREATER SAPHENOUS VEIN (Right) Mobilize Diuresis Diabetes control d/c tubes/lines Continue foley due to patient in ICU and urinary output monitoring See progression orders   LOS: 1 day    BARTLE,BRYAN K 05/23/2012

## 2012-05-24 ENCOUNTER — Inpatient Hospital Stay (HOSPITAL_COMMUNITY): Payer: 59

## 2012-05-24 LAB — BASIC METABOLIC PANEL
CO2: 28 mEq/L (ref 19–32)
Calcium: 8.5 mg/dL (ref 8.4–10.5)
GFR calc non Af Amer: >90 mL/min (ref >90)
Sodium: 134 mEq/L — ABNORMAL LOW (ref 135–145)

## 2012-05-24 LAB — CBC
Platelets: 164 10*3/uL (ref 150–400)
RBC: 3.53 MIL/uL — ABNORMAL LOW (ref 4.22–5.81)
WBC: 15.6 10*3/uL — ABNORMAL HIGH (ref 4.0–10.5)

## 2012-05-24 LAB — GLUCOSE, CAPILLARY
Glucose-Capillary: 123 mg/dL — ABNORMAL HIGH (ref 70–99)
Glucose-Capillary: 174 mg/dL — ABNORMAL HIGH (ref 70–99)

## 2012-05-24 MED ORDER — AMIODARONE HCL IN DEXTROSE 360-4.14 MG/200ML-% IV SOLN
INTRAVENOUS | Status: AC
  Administered 2012-05-24: 60 mg/h via INTRAVENOUS
  Filled 2012-05-24: qty 200

## 2012-05-24 MED ORDER — AMIODARONE HCL IN DEXTROSE 360-4.14 MG/200ML-% IV SOLN
30.0000 mg/h | INTRAVENOUS | Status: AC
  Administered 2012-05-25: 30 mg/h via INTRAVENOUS
  Filled 2012-05-24 (×3): qty 200

## 2012-05-24 MED ORDER — AMIODARONE LOAD VIA INFUSION
150.0000 mg | Freq: Once | INTRAVENOUS | Status: AC
  Administered 2012-05-24: 150 mg via INTRAVENOUS

## 2012-05-24 MED ORDER — AMIODARONE HCL IN DEXTROSE 360-4.14 MG/200ML-% IV SOLN
60.0000 mg/h | INTRAVENOUS | Status: AC
  Administered 2012-05-24: 60 mg/h via INTRAVENOUS
  Administered 2012-05-24 (×2): 60 mg/h
  Administered 2012-05-24: 60 mg/h via INTRAVENOUS
  Filled 2012-05-24: qty 200

## 2012-05-24 NOTE — Significant Event (Signed)
1352pm-became tachycardiac with highest HR up to 170s after returning to bed from sitting in chair. 5mg  lopressor IV given, with HR still in 150-160s, then abruptly turned into A Fib with HR 120-140s, SBP in high 80s at the time, pt is symptomatic and endorsed palpitations; pt denied SOB or dizziness at this time. MD Bartle in unit and made aware. Verbal orders to give bolus of amiodarone and start infusion drip.   1435pm-Converted from A. Fib. HR in low 100s at this time, sinus tach, pt resting comfortable. Will monitor closely. Costas Sena, Charity fundraiser.

## 2012-05-24 NOTE — Progress Notes (Signed)
2 Days Post-Op Procedure(s) (LRB): CORONARY ARTERY BYPASS GRAFTING (CABG) (N/A) ENDOVEIN HARVEST OF GREATER SAPHENOUS VEIN (Right) Subjective:  Does not feel well right now due to rapid A-fib.  Objective: Vital signs in last 24 hours: Temp:  [98.1 F (36.7 C)-99.3 F (37.4 C)] 98.1 F (36.7 C) (10/27 0812) Pulse Rate:  [97-147] 107  (10/27 1300) Cardiac Rhythm:  [-] Normal sinus rhythm (10/27 0745) Resp:  [7-25] 25  (10/27 1300) BP: (109-142)/(45-74) 119/71 mmHg (10/27 1200) SpO2:  [96 %-99 %] 99 % (10/27 1300) Weight:  [98.6 kg (217 lb 6 oz)] 98.6 kg (217 lb 6 oz) (10/27 0600)  Hemodynamic parameters for last 24 hours:    Intake/Output from previous day: 10/26 0701 - 10/27 0700 In: 634 [I.V.:628; IV Piggyback:6] Out: 2800 [Urine:2700; Chest Tube:100] Intake/Output this shift: Total I/O In: 120 [I.V.:120] Out: 410 [Urine:410]  General appearance: alert and cooperative Heart: irregularly irregular rhythm Lungs: diminished breath sounds bibasilar Extremities: edema minimal Wound: incision ok  Lab Results:  Basename 05/24/12 0434 05/23/12 1716 05/23/12 1715  WBC 15.6* -- 18.1*  HGB 10.0* 11.2* --  HCT 29.7* 33.0* --  PLT 164 -- 216   BMET:  Basename 05/24/12 0434 05/23/12 1716 05/23/12 0403  NA 134* 137 --  K 4.1 4.1 --  CL 98 97 --  CO2 28 -- 23  GLUCOSE 135* 158* --  BUN 10 9 --  CREATININE 0.70 0.90 --  CALCIUM 8.5 -- 8.0*    PT/INR:  Basename 05/22/12 1330  LABPROT 16.1*  INR 1.32   ABG    Component Value Date/Time   PHART 7.354 05/22/2012 1913   HCO3 26.2* 05/22/2012 1913   TCO2 25 05/23/2012 1716   ACIDBASEDEF 1.0 05/22/2012 1717   O2SAT 99.0 05/22/2012 1913   CBG (last 3)   Basename 05/24/12 0809 05/24/12 0342 05/24/12 0015  GLUCAP 141* 123* 174*   CXR:  Left base atelectasis  Assessment/Plan: S/P Procedure(s) (LRB): CORONARY ARTERY BYPASS GRAFTING (CABG) (N/A) ENDOVEIN HARVEST OF GREATER SAPHENOUS VEIN (Right) Mobilize Diabetes  control Postop A-fib:  Amiodarone just started.   LOS: 2 days    Evelene Croon K 05/24/2012

## 2012-05-25 ENCOUNTER — Encounter (HOSPITAL_COMMUNITY): Payer: Self-pay | Admitting: Thoracic Surgery (Cardiothoracic Vascular Surgery)

## 2012-05-25 LAB — BASIC METABOLIC PANEL
BUN: 11 mg/dL (ref 6–23)
CO2: 29 mEq/L (ref 19–32)
Chloride: 97 mEq/L (ref 96–112)
GFR calc Af Amer: >90 mL/min (ref >90)
Glucose, Bld: 122 mg/dL — ABNORMAL HIGH (ref 70–99)
Potassium: 3.4 mEq/L — ABNORMAL LOW (ref 3.5–5.1)

## 2012-05-25 LAB — GLUCOSE, CAPILLARY
Glucose-Capillary: 124 mg/dL — ABNORMAL HIGH (ref 70–99)
Glucose-Capillary: 142 mg/dL — ABNORMAL HIGH (ref 70–99)
Glucose-Capillary: 91 mg/dL (ref 70–99)

## 2012-05-25 LAB — CBC
HCT: 29 % — ABNORMAL LOW (ref 39.0–52.0)
Hemoglobin: 9.9 g/dL — ABNORMAL LOW (ref 13.0–17.0)
RBC: 3.52 MIL/uL — ABNORMAL LOW (ref 4.22–5.81)
WBC: 13.5 10*3/uL — ABNORMAL HIGH (ref 4.0–10.5)

## 2012-05-25 MED ORDER — FLUOCINONIDE 0.05 % EX CREA
TOPICAL_CREAM | Freq: Two times a day (BID) | CUTANEOUS | Status: DC
  Administered 2012-05-25 (×2): via TOPICAL
  Filled 2012-05-25: qty 30

## 2012-05-25 MED ORDER — ALPRAZOLAM 0.25 MG PO TABS: 0.2500 mg | ORAL_TABLET | Freq: Four times a day (QID) | ORAL | Status: DC | PRN

## 2012-05-25 MED ORDER — POTASSIUM CHLORIDE 10 MEQ/50ML IV SOLN
INTRAVENOUS | Status: AC
  Administered 2012-05-25: 10 meq via INTRAVENOUS
  Filled 2012-05-25: qty 150

## 2012-05-25 MED ORDER — METOPROLOL TARTRATE 25 MG PO TABS
25.0000 mg | ORAL_TABLET | Freq: Two times a day (BID) | ORAL | Status: DC
  Administered 2012-05-25: 25 mg via ORAL
  Filled 2012-05-25 (×2): qty 1

## 2012-05-25 MED ORDER — SODIUM CHLORIDE 0.9 % IJ SOLN
3.0000 mL | Freq: Two times a day (BID) | INTRAMUSCULAR | Status: DC
  Administered 2012-05-25 – 2012-05-26 (×2): 3 mL via INTRAVENOUS

## 2012-05-25 MED ORDER — SODIUM CHLORIDE 0.9 % IV SOLN: 250.0000 mL | INTRAVENOUS | Status: DC | PRN

## 2012-05-25 MED ORDER — SODIUM CHLORIDE 0.9 % IJ SOLN: 3.0000 mL | INTRAMUSCULAR | Status: DC | PRN

## 2012-05-25 MED ORDER — MAGNESIUM HYDROXIDE 400 MG/5ML PO SUSP: 30.0000 mL | Freq: Every day | ORAL | Status: DC | PRN

## 2012-05-25 MED ORDER — INSULIN ASPART 100 UNIT/ML ~~LOC~~ SOLN: 0.0000 [IU] | Freq: Every day | SUBCUTANEOUS | Status: DC

## 2012-05-25 MED ORDER — MOVING RIGHT ALONG BOOK
Freq: Once | Status: AC
  Administered 2012-05-25: 15:00:00
  Filled 2012-05-25: qty 1

## 2012-05-25 MED ORDER — POTASSIUM CHLORIDE CRYS ER 20 MEQ PO TBCR
40.0000 meq | EXTENDED_RELEASE_TABLET | Freq: Two times a day (BID) | ORAL | Status: AC
  Administered 2012-05-25: 40 meq via ORAL
  Filled 2012-05-25: qty 2

## 2012-05-25 MED ORDER — POTASSIUM CHLORIDE 10 MEQ/50ML IV SOLN
10.0000 meq | INTRAVENOUS | Status: AC
  Administered 2012-05-25 (×3): 10 meq via INTRAVENOUS

## 2012-05-25 MED ORDER — ZOLPIDEM TARTRATE 5 MG PO TABS: 5.0000 mg | ORAL_TABLET | Freq: Every evening | ORAL | Status: DC | PRN

## 2012-05-25 MED ORDER — LINAGLIPTIN 5 MG PO TABS
5.0000 mg | ORAL_TABLET | Freq: Every day | ORAL | Status: DC
  Administered 2012-05-25 – 2012-05-26 (×2): 5 mg via ORAL
  Filled 2012-05-25 (×2): qty 1

## 2012-05-25 MED ORDER — METOPROLOL TARTRATE 25 MG/10 ML ORAL SUSPENSION
25.0000 mg | Freq: Two times a day (BID) | ORAL | Status: DC
  Administered 2012-05-25 – 2012-05-26 (×2): 25 mg
  Filled 2012-05-25 (×4): qty 10

## 2012-05-25 MED ORDER — FUROSEMIDE 40 MG PO TABS
40.0000 mg | ORAL_TABLET | Freq: Every day | ORAL | Status: DC
  Administered 2012-05-25 – 2012-05-26 (×2): 40 mg via ORAL
  Filled 2012-05-25 (×2): qty 1

## 2012-05-25 MED ORDER — METFORMIN HCL ER 500 MG PO TB24
500.0000 mg | ORAL_TABLET | Freq: Two times a day (BID) | ORAL | Status: DC
  Administered 2012-05-25 – 2012-05-26 (×2): 500 mg via ORAL
  Filled 2012-05-25 (×4): qty 1

## 2012-05-25 MED ORDER — INSULIN ASPART 100 UNIT/ML ~~LOC~~ SOLN
0.0000 [IU] | Freq: Three times a day (TID) | SUBCUTANEOUS | Status: DC
  Administered 2012-05-25: 2 [IU] via SUBCUTANEOUS
  Administered 2012-05-25: 4 [IU] via SUBCUTANEOUS
  Administered 2012-05-25 – 2012-05-26 (×2): 3 [IU] via SUBCUTANEOUS
  Administered 2012-05-26: 2 [IU] via SUBCUTANEOUS

## 2012-05-25 MED ORDER — GUAIFENESIN-DM 100-10 MG/5ML PO SYRP: 15.0000 mL | ORAL_SOLUTION | ORAL | Status: DC | PRN

## 2012-05-25 MED ORDER — AMIODARONE HCL 200 MG PO TABS
400.0000 mg | ORAL_TABLET | Freq: Two times a day (BID) | ORAL | Status: DC
  Administered 2012-05-25 – 2012-05-26 (×3): 400 mg via ORAL
  Filled 2012-05-25 (×4): qty 2

## 2012-05-25 NOTE — Progress Notes (Signed)
Pt ambulated 200 ft with RW x 1 assist, slow steady gait.

## 2012-05-25 NOTE — Progress Notes (Addendum)
[facility] DAILY PROGRESS NOTE                   [address]            [place] [address]          [phone]      3 Days Post-Op Procedure(s) (LRB): CORONARY ARTERY BYPASS GRAFTING (CABG) (N/A) ENDOVEIN HARVEST OF GREATER SAPHENOUS VEIN (Right)  Total Length of Stay:  LOS: 3 days   Subjective: Some productive cough, palpitations have improved, some sternal incision pain  Objective: Vital signs in last 24 hours: Temp:  [97.8 F (36.6 C)-99.3 F (37.4 C)] 98.9 F (37.2 C) (10/28 0717) Pulse Rate:  [81-167] 102  (10/28 0600) Cardiac Rhythm:  [-] Sinus tachycardia (10/28 0600) Resp:  [12-28] 18  (10/28 0600) BP: (88-166)/(50-75) 131/63 mmHg (10/28 0600) SpO2:  [92 %-100 %] 93 % (10/28 0600)  Filed Weights   05/21/12 1300 05/23/12 0600 05/24/12 0600  Weight: 216 lb 9.6 oz (98.249 kg) 221 lb 5.5 oz (100.4 kg) 217 lb 6 oz (98.6 kg)    Weight change:    Hemodynamic parameters for last 24 hours:    Intake/Output from previous day: 10/27 0701 - 10/28 0700 In: 1023.2 [P.O.:360; I.V.:663.2] Out: 1980 [Urine:1980]  Intake/Output this shift:    Current Meds: Scheduled Meds:   . acetaminophen  1,000 mg Oral Q6H   Or  . acetaminophen (TYLENOL) oral liquid 160 mg/5 mL  975 mg Per Tube Q6H  . amiodarone  150 mg Intravenous Once  . aspirin EC  325 mg Oral Daily   Or  . aspirin  324 mg Per Tube Daily  . bisacodyl  10 mg Oral Daily   Or  . bisacodyl  10 mg Rectal Daily  . docusate sodium  200 mg Oral Daily  . enoxaparin (LOVENOX) injection  40 mg Subcutaneous Q24H  . ezetimibe-simvastatin  1 tablet Oral QHS  . insulin aspart  0-24 Units Subcutaneous Q4H  . insulin glargine  30 Units Subcutaneous Daily  . insulin regular  0-10 Units Intravenous TID WC  . metoCLOPramide (REGLAN) injection  10 mg Intravenous Q6H  . metoprolol tartrate  25 mg Oral BID   Or  . metoprolol tartrate  12.5 mg Per Tube BID  . pantoprazole  40 mg Oral Daily  . potassium chloride       . sodium chloride  3 mL Intravenous Q12H   Continuous Infusions:   . sodium chloride Stopped (05/23/12 1200)  . sodium chloride 10 mL/hr at 05/25/12 0700  . sodium chloride    . amiodarone (NEXTERONE PREMIX) 360 mg/200 mL dextrose 60 mg/hr (05/24/12 1800)   And  . amiodarone (NEXTERONE PREMIX) 360 mg/200 mL dextrose 30 mg/hr (05/25/12 0700)  . insulin (NOVOLIN-R) infusion Stopped (05/23/12 1000)  . DISCONTD: lactated ringers Stopped (05/24/12 1352)  . DISCONTD: nitroGLYCERIN Stopped (05/23/12 1730)  . DISCONTD: phenylephrine (NEO-SYNEPHRINE) Adult infusion 0 mcg/min (05/22/12 1300)   PRN Meds:.metoprolol, morphine injection, ondansetron (ZOFRAN) IV, oxyCODONE, sodium chloride  General appearance: alert, cooperative and no distress Heart: irregularly irregular rhythm Lungs: tubular left lung base Abdomen: soft, nontender Extremities: min edema Wound: incisions healing well  Lab Results: CBC: Basename 05/25/12 0400 05/24/12 0434  WBC 13.5* 15.6*  HGB 9.9* 10.0*  HCT 29.0* 29.7*  PLT 209 164   BMET:  Basename 05/25/12 0400 05/24/12 0434  NA 134* 134*  K 3.4* 4.1  CL 97 98  CO2 29 28  GLUCOSE  122* 135*  BUN 11 10  CREATININE 0.63 0.70  CALCIUM 8.5 8.5    PT/INR:  Basename 05/22/12 1330  LABPROT 16.1*  INR 1.32   Radiology: Dg Chest Portable 1 View In Am  05/24/2012  *RADIOLOGY REPORT*  Clinical Data: Post open heart surgery 2 days ago.  PORTABLE CHEST - 1 VIEW  Comparison: 05/23/2012.  Findings: Post CABG.  Prior right subclavian stent placement.  Right central line tip proximal aspect of the proximal superior vena cava level.  Swan-Ganz catheters been removed.  Mediastinal drain and left-sided chest tube have been removed. Atelectasis is seen along the course of the removed with left-sided chest tube.  Cardiomegaly.  Left base atelectasis.  Suggestion of air fluid level left base.  A loculated hydropneumothorax cannot be excluded.  Attention to this on follow-up.   Mildly tortuous aorta.  IMPRESSION:  Mediastinal drain and left-sided chest tube have been removed. Atelectasis is seen along the course of the removed with left-sided chest tube.  Left base atelectasis.  Suggestion of air fluid level left base.  A loculated hydropneumothorax cannot be excluded.  Attention to this on follow-up.  Cardiomegaly  This is a call report.   Original Report Authenticated By: Fuller Canada, M.D.      Assessment/Plan: S/P Procedure(s) (LRB): CORONARY ARTERY BYPASS GRAFTING (CABG) (N/A) ENDOVEIN HARVEST OF GREATER SAPHENOUS VEIN (Right)   1. Afib alt with sinus tachy- cont IV amiodarone, replace K+ 2 H/H stable 3 push rehab/pulm toilet as able 4 on SQ lovenox 5 Cont SSI/lantus , will need to transition to oral meds over next few days   GOLD,WAYNE E 05/25/2012 7:33 AM   patient seen and examined. Agree with above. In SR with PACs currently- give lopressor now, change amio to PO Transfer to 2000

## 2012-05-25 NOTE — Progress Notes (Signed)
Patient transferred from Unit 2300 to Unit 2000, after report was given to receiving RN.  Patient's medications, chart, and personal belongings all sent with patient.  Wife present during transfer.    SMITH, Abie Killian A, RN 

## 2012-05-25 NOTE — Progress Notes (Signed)
Inpatient Diabetes Program Recommendations  AACE/ADA: New Consensus Statement on Inpatient Glycemic Control (2013)  Target Ranges:  Prepandial:   less than 140 mg/dL      Peak postprandial:   less than 180 mg/dL (1-2 hours)      Critically ill patients:  140 - 180 mg/dL   Reason for Visit:   Results for MYRON, HINNENKAMP (MRN [id number]) as of 05/25/2012 14:01  Ref. Range 05/25/2012 00:20 05/25/2012 07:16 05/25/2012 12:07  Glucose-Capillary Latest Range: 70-99 mg/dL 91 213 (H) 086 (H)  Results for REINHARD, SIRAK (MRN [id number]) as of 05/25/2012 14:01  Ref. Range 05/20/2012 09:00  Hemoglobin A1C Latest Range: <5.7 % 8.0 (H)   Spoke to patient and wife regarding diabetes.  He see's Dr. Donette Larry and notes that his A1C has increased.  Patient appears to have knowledge regarding diabetes and goal A1C.  Discussed importance of glycemic control after discharge and need for more frequent CBG checks.  Note plans to transition patient off basal insulin to oral hypoglycemics only.

## 2012-05-25 NOTE — Plan of Care (Signed)
Problem: Phase III Progression Outcomes Goal: Time patient transferred to PCTU/Telemetry POD Outcome: Completed/Met Date Met:  05/25/12 1310

## 2012-05-25 NOTE — Progress Notes (Signed)
Pt sts he has walked x2 today and would like to rest now. Will f/u in am. Ethelda Chick CES, ACSM

## 2012-05-26 ENCOUNTER — Inpatient Hospital Stay (HOSPITAL_COMMUNITY): Payer: 59

## 2012-05-26 DIAGNOSIS — Z951 Presence of aortocoronary bypass graft: Secondary | ICD-10-CM

## 2012-05-26 LAB — CBC
HCT: 31 % — ABNORMAL LOW (ref 39.0–52.0)
MCHC: 33.2 g/dL (ref 30.0–36.0)
RDW: 13.9 % (ref 11.5–15.5)

## 2012-05-26 LAB — BASIC METABOLIC PANEL
BUN: 14 mg/dL (ref 6–23)
Calcium: 8.7 mg/dL (ref 8.4–10.5)
Creatinine, Ser: 0.67 mg/dL (ref 0.50–1.35)
GFR calc Af Amer: >90 mL/min (ref >90)
GFR calc non Af Amer: >90 mL/min (ref >90)

## 2012-05-26 LAB — GLUCOSE, CAPILLARY
Glucose-Capillary: 138 mg/dL — ABNORMAL HIGH (ref 70–99)
Glucose-Capillary: 165 mg/dL — ABNORMAL HIGH (ref 70–99)

## 2012-05-26 MED ORDER — AMIODARONE HCL 400 MG PO TABS: 400.0000 mg | ORAL_TABLET | Freq: Two times a day (BID) | ORAL | Status: DC

## 2012-05-26 MED ORDER — LACTULOSE 10 GM/15ML PO SOLN
20.0000 g | Freq: Once | ORAL | Status: AC
  Administered 2012-05-26: 20 g via ORAL
  Filled 2012-05-26: qty 30

## 2012-05-26 MED ORDER — METOPROLOL TARTRATE 25 MG PO TABS
25.0000 mg | ORAL_TABLET | Freq: Three times a day (TID) | ORAL | Status: DC
  Filled 2012-05-26 (×3): qty 1

## 2012-05-26 MED ORDER — METOPROLOL TARTRATE 25 MG PO TABS: 25.0000 mg | ORAL_TABLET | Freq: Three times a day (TID) | ORAL | Status: DC

## 2012-05-26 MED ORDER — FUROSEMIDE 40 MG PO TABS: 40.0000 mg | ORAL_TABLET | Freq: Every day | ORAL | Status: DC

## 2012-05-26 MED ORDER — ASPIRIN 325 MG PO TBEC: 325.0000 mg | DELAYED_RELEASE_TABLET | Freq: Every day | ORAL | Status: DC

## 2012-05-26 MED ORDER — ASPIRIN 81 MG PO TBEC: 81.0000 mg | DELAYED_RELEASE_TABLET | Freq: Every day | ORAL | Status: AC

## 2012-05-26 MED ORDER — RAMIPRIL 10 MG PO CAPS
10.0000 mg | ORAL_CAPSULE | Freq: Every day | ORAL | Status: DC
  Administered 2012-05-26: 10 mg via ORAL
  Filled 2012-05-26: qty 1

## 2012-05-26 MED ORDER — GUAIFENESIN-DM 100-10 MG/5ML PO SYRP: 15.0000 mL | ORAL_SOLUTION | ORAL | Status: DC | PRN

## 2012-05-26 MED ORDER — POTASSIUM CHLORIDE CRYS ER 20 MEQ PO TBCR: 40.0000 meq | EXTENDED_RELEASE_TABLET | Freq: Two times a day (BID) | ORAL | Status: DC

## 2012-05-26 MED ORDER — CLOPIDOGREL BISULFATE 75 MG PO TABS: 75.0000 mg | ORAL_TABLET | Freq: Every day | ORAL | Status: AC

## 2012-05-26 MED ORDER — OXYCODONE HCL 5 MG PO TABS: 5.0000 mg | ORAL_TABLET | ORAL | Status: DC | PRN

## 2012-05-26 MED ORDER — RAMIPRIL 10 MG PO TABS: 10.0000 mg | ORAL_TABLET | Freq: Every day | ORAL | Status: DC

## 2012-05-26 MED FILL — Sodium Chloride IV Soln 0.9%: INTRAVENOUS | Qty: 1000 | Status: AC

## 2012-05-26 MED FILL — Sodium Chloride Irrigation Soln 0.9%: Qty: 3000 | Status: AC

## 2012-05-26 MED FILL — Lidocaine HCl IV Inj 20 MG/ML: INTRAVENOUS | Qty: 5 | Status: AC

## 2012-05-26 MED FILL — Magnesium Sulfate Inj 50%: INTRAMUSCULAR | Qty: 10 | Status: AC

## 2012-05-26 MED FILL — Potassium Chloride Inj 2 mEq/ML: INTRAVENOUS | Qty: 40 | Status: AC

## 2012-05-26 MED FILL — Mannitol IV Soln 20%: INTRAVENOUS | Qty: 500 | Status: AC

## 2012-05-26 MED FILL — Heparin Sodium (Porcine) Inj 1000 Unit/ML: INTRAMUSCULAR | Qty: 60 | Status: AC

## 2012-05-26 MED FILL — Heparin Sodium (Porcine) Inj 1000 Unit/ML: INTRAMUSCULAR | Qty: 30 | Status: AC

## 2012-05-26 MED FILL — Electrolyte-R (PH 7.4) Solution: INTRAVENOUS | Qty: 3000 | Status: AC

## 2012-05-26 MED FILL — Sodium Bicarbonate IV Soln 8.4%: INTRAVENOUS | Qty: 50 | Status: AC

## 2012-05-26 NOTE — Progress Notes (Signed)
[id number] Education completed with pt and wife. Permission given to refer to [facility] Phase 2. Reviewed counting carbs. Halima Fogal DunlapRN

## 2012-05-26 NOTE — Progress Notes (Signed)
Pt. Given discharge instructions, prescriptions, and incision care instructions as well as instructions on F/U care.  Utilized teach-back method to ascertain understanding of information by patient and his wife.  Pt. Discharged to home via private vehicle accompanied by wife.  Escorted to exit by nurse tech via wheelchair.

## 2012-05-26 NOTE — Discharge Summary (Signed)
Physician Discharge Summary  Patient ID: Colin Parks MRN: [id number] DOB/AGE: 12/31/1950 61 y.o.  Admit date: 05/22/2012 Discharge date: 05/26/2012  Admission Diagnoses:  Patient Active Problem List  Diagnosis  . 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 Abdominal aneurysm without mention of rupture  . Coronary atherosclerosis of native coronary artery  . PVD (peripheral vascular disease)  . S/P CABG x 5   Discharge Diagnoses:   Patient Active Problem List  Diagnosis  . 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 Abdominal aneurysm without mention of rupture  . Coronary atherosclerosis of native coronary artery  . PVD (peripheral vascular disease)  . S/P CABG x 5   Discharged Condition: good  History of Present Illness:   Colin Parks is a 61 yo while male with known history of Diabetes, Dyslipidemia, and Peripheral arterial disease.  Colin Parks is S/P Stroke with some mild right sided weakness in 2006 and Left Carotid Endarterectomy 2006 and right 2009.  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  Colin Parks has no known history of CAD.  However, approximately one month ago the patient developed chest tightness while working in his yard.  The pain was located midsternally and was improved with rest.  The patient resumed his work and the pain came back with hot flashes and some diaphoresis.  Colin Parks followed up with Dr. Donette Larry who recommended the patient undergo stress testing which showed some possible ischemia in the inferior basal/mid inferior wall.  Due to this Colin Parks was referred to Dr. Anne Fu who recommended proceeding with cardiac catheterization.  This was performed on 05/14/2012 and revealed a preserved EF and three vessel CAD.  It was felt the patient would best be treated with coronary bypass and Colin Parks was referred to [facility] for possible surgical evaluation.  Colin Parks was evaluated by Dr. Dorris Fetch on  05/19/2012 at which time it was felt the patient would benefit from coronary artery bypass.  The risks and benefits of the procedure were explained to the patient and Colin Parks was agreeable to proceed.  His surgery was scheduled for 05/22/2012  Hospital Course:   On October 25. 2013 the patient presented to [facility].  Colin Parks was taken to the operating room and underwent CABG x 5 utilizing LIMA to LAD, Sequential SVG to Diagonal 1 and Diagonal 2, Sequential SVG to OM1 and distal PDA.  Colin Parks also underwent endoscopic saphenous vein harvest of his right leg.  The patient tolerated the procedure well and was taken to the SICU in stable condition.  POD #0 the patient was extubated.  POD #1 the patient's chest tubes and arterial lines were removed without difficulty.  POD #2 patient developed rapid atrial fibrillation.  Colin Parks was placed on IV amiodarone with successful conversion to NSR.  POD #3 patient with sinus tachycardia.  Colin Parks will remain on IV Amiodarone.  Colin Parks was medically stable and transferred to the step down unit in stable condition.  POD #4 the patient was transitioned to oral diabetic medications.  Colin Parks is maintaining NSR and was tolerated oral Amiodarone.  The patient is doing well.  Should not further issues arise we will plan for d/c home in the next 24-48 hours.  Colin Parks will need to follow up with Dr. Dorris Fetch on 06/16/2012 at 11:00 AM. Colin Parks will need to contact Dr. Judd Gaudier office to set up a 2 week follow up appointment.    Significant Diagnostic Studies: cardiac  catheterization  HEMODYNAMICS: Aortic pressure was 125/67 mmHg; LV systolic pressure was 125 mmHg; LVEDP . There was no gradient between the left ventricle and aorta.  ANGIOGRAPHIC DATA:  Left main: Patent, branches into circumflex as well as left anterior descending artery. No angiographically significant disease  Left anterior descending (LAD): Severe disease beginning in the proximal segment of the LAD with approximately 80% stenosis just  prior to the bifurcation of a large diagonal branch. The remainder of the LAD, is diffusely diseased with lesions of up to 90% severity throughout its mid segment. The LAD is small in caliber and traverses to the apex. The first diagonal branch has approximately 99% stenosis proximally then bifurcates. The second diagonal branch also has 90% mid stenosis just after a mid vessel bifurcation.  Circumflex artery (CIRC): The circumflex artery is occluded just after the bifurcation of a moderate-sized first obtuse marginal branch. There are left to left collaterals slowly filling the remainder of the AV groove circumflex as well as second obtuse marginal branch.  Right coronary artery (RCA): There is proximal 30% stenosis, in the posterior descending artery, this vessel begins at its ostium is a moderate-sized vessel and has significant stenosis especially in its midportion of up to 90%.  LEFT VENTRICULOGRAM: Left ventricular angiogram was done in the 30 RAO projection and revealed normal left ventricular wall motion and systolic function with an estimated ejection fraction of 65 %.   Treatments: surgery:   Median sternotomy, extracorporeal circulation, coronary  artery bypass grafting x5 (left internal mammary artery to left anterior  descending, sequential saphenous vein graft to diagonal 1 and diagonal  2, sequential saphenous vein graft to obtuse marginal 2 and distal  posterior descending), endoscopic vein harvest, right leg.   The patient has been discharged on:   1.Beta Blocker:  Yes [  x ]                              No   [   ]                              If No, reason:  2.Ace Inhibitor/ARB: Yes [ x  ]                                     No  [    ]                                     If No, reason:  3.Statin:   Yes [  x ]                  No  [   ]                  If No, reason:  4.Ecasa:  Yes  [  x ]                  No   [   ]                  If No,  reason:     Disposition: 01-Home or Self Care      Discharge Orders    Future Appointments:  Provider: Department: Dept Phone: Center:   06/16/2012 11:00 AM Loreli Slot, MD [facility] [phone] [facility]   03/03/2013 2:30 PM [facility] [phone] [facility]   03/03/2013 3:30 PM [facility] [phone] [facility]   03/03/2013 4:00 PM Evern Bio, NP [facility] [phone] [facility]    1.Follow up with HENDRICKSON,STEVEN C, MD. (PA/LAT CXR to be taken (at [facility] which is in the same building as Dr. Sunday Corn office) on 06/16/2012 at 10:00 am;Appointment with Dr. Dorris Fetch is on 06/16/2012 at 11:00 am) Contact information: [address]  [address]  [place] [address]  [phone]  2.Follow up with Donato Schultz, MD. (Call for a follow up appointment for 2 weeks) Contact information: [address]  [place] [address]  [phone]  3.Follow up with Georgann Housekeeper, MD. (Call for an appointment as soon as possible regarding further diabetes management and a HGA1C of 8) Contact information: [address]  [place] [address]  [phone]      Medication List     As of 05/26/2012  2:36 PM    STOP taking these medications         acetaminophen 650 MG CR tablet   Commonly known as: TYLENOL      aspirin 81 MG tablet      TAKE these medications         amiodarone 400 MG tablet   Commonly known as: PACERONE   Take 1 tablet (400 mg total) by mouth 2 (two) times daily. For one week;then take Amiodarone 400 mg po daily thereaftet      aspirin 81 MG EC tablet   Take 1 tablet (81 mg total) by mouth daily. Swallow whole.      clopidogrel 75 MG tablet   Commonly known as: PLAVIX   Take 1 tablet (75 mg total) by mouth daily.      desoximetasone 0.25 % cream   Commonly known as: TOPICORT   Apply 0.25 % topically as needed. Rash on legs      ezetimibe-simvastatin 10-20 MG per tablet   Commonly  known as: VYTORIN   Take 1 tablet by mouth at bedtime.      fexofenadine 180 MG tablet   Commonly known as: ALLEGRA   Take 180 mg by mouth daily.      furosemide 40 MG tablet   Commonly known as: LASIX   Take 1 tablet (40 mg total) by mouth daily. For 5 days      GLUCOSAMINE CHONDR 1500 COMPLX PO   Take 1 capsule by mouth daily.      GNP MEGA MULTI FOR MEN PO   Take by mouth 2 (two) times daily.      guaiFENesin-dextromethorphan 100-10 MG/5ML syrup   Commonly known as: ROBITUSSIN DM   Take 15 mLs by mouth every 4 (four) hours as needed for cough.      lansoprazole 15 MG capsule   Commonly known as: PREVACID   Take 15 mg by mouth daily.      metFORMIN 500 MG (MOD) 24 hr tablet   Commonly known as: GLUMETZA   Take 500 mg by mouth 2 (two) times daily with a meal.      metoprolol tartrate 25 MG tablet   Commonly known as: LOPRESSOR   Take 1 tablet (25 mg total) by mouth 3 (three) times daily.      oxyCODONE 5 MG immediate release tablet   Commonly known as: Oxy IR/ROXICODONE  Take 1-2 tablets (5-10 mg total) by mouth every 4 (four) hours as needed.      potassium chloride SA 20 MEQ tablet   Commonly known as: K-DUR,KLOR-CON   Take 2 tablets (40 mEq total) by mouth 2 (two) times daily. For 5 days      ramipril 10 MG tablet   Commonly known as: ALTACE   Take 10 mg by mouth daily.      sitaGLIPtin 100 MG tablet   Commonly known as: JANUVIA   Take 100 mg by mouth daily.             SignedLowella Dandy 05/26/2012, 12:17 PM

## 2012-05-26 NOTE — Progress Notes (Addendum)
                   [address]            [place] [address]          [phone]      4 Days Post-Op Procedure(s) (LRB): CORONARY ARTERY BYPASS GRAFTING (CABG) (N/A) ENDOVEIN HARVEST OF GREATER SAPHENOUS VEIN (Right)  Subjective: Patient eating breakfast. Only complaint is unable to sleep.  Objective: Vital signs in last 24 hours: Temp:  [97.1 F (36.2 C)-99.1 F (37.3 C)] 98.1 F (36.7 C) (10/29 0511) Pulse Rate:  [96-139] 96  (10/29 0511) Cardiac Rhythm:  [-] Sinus tachycardia (10/28 2000) Resp:  [18-24] 20  (10/29 0511) BP: (121-169)/(65-85) 146/80 mmHg (10/29 0646) SpO2:  [92 %-100 %] 95 % (10/29 0511) FiO2 (%):  [100 %] 100 % (10/28 1445) Weight:  [210 lb (95.255 kg)] 210 lb (95.255 kg) (10/29 0511)  Pre op weight 98  kg Current Weight  05/26/12 210 lb (95.255 kg)      Intake/Output from previous day: 10/28 0701 - 10/29 0700 In: 1346.8 [P.O.:1120; I.V.:76.8; IV Piggyback:150] Out: 900 [Urine:900]   Physical Exam:  Cardiovascular: Tachycardic Pulmonary: Clear on right and diminished on left; no rales, wheezes, or rhonchi. Abdomen: Soft, non tender, bowel sounds present. Extremities: Trace bilateral lower extremity edema. Wounds: Clean and dry.  No erythema or signs of infection.  Lab Results: CBC: Basename 05/25/12 0400 05/24/12 0434  WBC 13.5* 15.6*  HGB 9.9* 10.0*  HCT 29.0* 29.7*  PLT 209 164   BMET:  Basename 05/25/12 0400 05/24/12 0434  NA 134* 134*  K 3.4* 4.1  CL 97 98  CO2 29 28  GLUCOSE 122* 135*  BUN 11 10  CREATININE 0.63 0.70  CALCIUM 8.5 8.5    PT/INR:  Lab Results  Component Value Date   INR 1.32 05/22/2012   INR 0.95 05/20/2012   INR 0.9 06/17/2007   ABG:  INR: Will add last result for INR, ABG once components are confirmed Will add last 4 CBG results once components are confirmed  Assessment/Plan:  1. CV - Afib previously,SR/ST. On Lopressor 25 bid an Amiodarone 400 bid. Will ncrease Lopressor to  tid.Consider restarting ACE-I  for better bp control. 2.  Pulmonary - Encourage incentive spirometer. CXR this am is stable 3.Remove EPW 4.  Acute blood loss anemia - Last H and H stable at 9.9 and 29. 5.DM- CBGs  124/161/138.  On Linagliptin 5 daily, Metformin 500 bid, and scheduled Insulin.Pre op HGA1C 8. Will need follow up as outpatient. 6.LOC constipation 7.Likely discharge in am  ZIMMERMAN,DONIELLE MPA-C 05/26/2012,7:26 AM  patient seen and examined. Agree with above. Will restart altace Home this afternoon if he maintains SR

## 2012-05-26 NOTE — Care Management Note (Signed)
    Page 1 of 1   05/26/2012     4:02:27 PM   CARE MANAGEMENT NOTE 05/26/2012  Patient:  Colin Parks, Colin Parks   Account Number:  [id number]  Date Initiated:  05/26/2012  Documentation initiated by:  Yehudit Fulginiti  Subjective/Objective Assessment:   PT S/P CABG X 5 ON 05/22/12.  PTA, PT INDEPENDENT, LIVES WITH SPOUSE.     Action/Plan:   NO HOME NEEDS IDENTIFIED.  PT FOR DC HOME TODAY   Anticipated DC Date:  05/26/2012   Anticipated DC Plan:  HOME/SELF CARE      DC Planning Services  CM consult      Choice offered to / List presented to:             Status of service:  Completed, signed off Medicare Important Message given?   (If response is "NO", the following Medicare IM given date fields will be blank) Date Medicare IM given:   Date Additional Medicare IM given:    Discharge Disposition:  HOME/SELF CARE  Per UR Regulation:  Reviewed for med. necessity/level of care/duration of stay  If discussed at Long Length of Stay Meetings, dates discussed:    Comments:

## 2012-05-26 NOTE — Progress Notes (Signed)
CARDIAC REHAB PHASE I   PRE:  Rate/Rhythm: 101 ST  BP:  Supine:   Sitting:   Standing:    SaO2: 95%RA  MODE:  Ambulation: 550 ft   POST:  Rate/Rhythem: 124 ST  BP:  Supine:   Sitting: 150/80  Standing:    SaO2: 100%RA 1115-1131 Pt tired by end of the walk. HR to 124. Pt walked 550 ft with steady gait. Wants wife here for ed. Will return after lunch to ed.  Duanne Limerick

## 2012-05-29 ENCOUNTER — Ambulatory Visit (INDEPENDENT_AMBULATORY_CARE_PROVIDER_SITE_OTHER): Payer: Self-pay | Admitting: Physician Assistant

## 2012-05-29 VITALS — BP 108/77 | HR 116 | Temp 98.3°F | Resp 16 | Ht 72.0 in | Wt 208.0 lb

## 2012-05-29 DIAGNOSIS — IMO0002 Reserved for concepts with insufficient information to code with codable children: Secondary | ICD-10-CM

## 2012-05-29 DIAGNOSIS — IMO0001 Reserved for inherently not codable concepts without codable children: Secondary | ICD-10-CM

## 2012-05-29 DIAGNOSIS — Z951 Presence of aortocoronary bypass graft: Secondary | ICD-10-CM

## 2012-05-29 NOTE — Progress Notes (Signed)
[address]            [place] [address]          [phone]     HPI: Mr. Daughdrill comes in today complaining of bleeding from his sternal incision site.  He is status post CABG x5 by Dr. Dorris Fetch on 05/22/2012, and was discharged home in good condition on 05/26/2012. He did well at home until yesterday, when he scrubbed his chest vigorously in the shower and later noted drainage from his incision site.  He has also had some cough.  He denies any fevers, chills, redness or purulence.  He presents at this time for evaluation.    Current Outpatient Prescriptions  Medication Sig Dispense Refill  . amiodarone (PACERONE) 400 MG tablet Take 1 tablet (400 mg total) by mouth 2 (two) times daily. For one week;then take Amiodarone 400 mg po daily thereaftet  90 tablet  1  . aspirin 81 MG EC tablet Take 1 tablet (81 mg total) by mouth daily. Swallow whole.  30 tablet  12  . clopidogrel (PLAVIX) 75 MG tablet Take 1 tablet (75 mg total) by mouth daily.      Marland Kitchen desoximetasone (TOPICORT) 0.25 % cream Apply 0.25 % topically as needed. Rash on legs      . ezetimibe-simvastatin (VYTORIN) 10-20 MG per tablet Take 1 tablet by mouth at bedtime.        . fexofenadine (ALLEGRA) 180 MG tablet Take 180 mg by mouth daily.        . furosemide (LASIX) 40 MG tablet Take 1 tablet (40 mg total) by mouth daily. For 5 days  5 tablet  0  . Glucosamine-Chondroit-Vit C-Mn (GLUCOSAMINE CHONDR 1500 COMPLX PO) Take 1 capsule by mouth daily.       Marland Kitchen guaiFENesin-dextromethorphan (ROBITUSSIN DM) 100-10 MG/5ML syrup Take 15 mLs by mouth every 4 (four) hours as needed for cough.  118 mL    . lansoprazole (PREVACID) 15 MG capsule Take 15 mg by mouth daily.        . metFORMIN (GLUMETZA) 500 MG (MOD) 24 hr tablet Take 500 mg by mouth 2 (two) times daily with a meal.        . metoprolol tartrate (LOPRESSOR) 25 MG tablet Take 1 tablet (25 mg total) by mouth 3 (three) times daily.  90 tablet  1  .  Multiple Vitamins-Minerals (GNP MEGA MULTI FOR MEN PO) Take by mouth 2 (two) times daily.        Marland Kitchen oxyCODONE (OXY IR/ROXICODONE) 5 MG immediate release tablet Take 1-2 tablets (5-10 mg total) by mouth every 4 (four) hours as needed.  30 tablet  0  . potassium chloride SA (K-DUR,KLOR-CON) 20 MEQ tablet Take 2 tablets (40 mEq total) by mouth 2 (two) times daily. For 5 days  10 tablet  0  . ramipril (ALTACE) 10 MG tablet Take 10 mg by mouth daily.        . sitaGLIPtin (JANUVIA) 100 MG tablet Take 100 mg by mouth daily.           Physical Exam: BP 108/77 HR  116 Resp 16 Temp 98.3 Wounds: There is serous oozing from the lower portion of the sternal incision, in an area where the suture line appears slightly separated.  No erythema or purulence.  Sternum feels stable with cough.  No clicking or instability. The right chest  tube site also has some serous drainage. Leg incision is stable. Heart: regular rate and rhythm Lungs: Clear Extremities: Mild LE edema    Assessment/Plan: There does appear to be some serous and serosanguinous drainage from the lower sternal incision, but no evidence of infection.  I suspect this is a combination of fat necrosis and overzealous washing of the area in the shower.  I have cleaned the area with betadine and placed a few steri-strips to approximate the wound edges a bit more, and have asked him to refrain from scrubbing the wound, but to clean it gently in the shower.  I don't feel any instability, and I don't believe antibiotics are indicated at this time. He is otherwise doing well.  We will plan to see him back in 3 weeks for his scheduled followup, and he will call if he has any fevers, chills or purulent drainage.

## 2012-06-12 ENCOUNTER — Other Ambulatory Visit: Payer: Self-pay | Admitting: Thoracic Surgery (Cardiothoracic Vascular Surgery)

## 2012-06-12 DIAGNOSIS — Z951 Presence of aortocoronary bypass graft: Secondary | ICD-10-CM

## 2012-06-16 ENCOUNTER — Encounter: Payer: Self-pay | Admitting: Thoracic Surgery (Cardiothoracic Vascular Surgery)

## 2012-06-16 ENCOUNTER — Ambulatory Visit (INDEPENDENT_AMBULATORY_CARE_PROVIDER_SITE_OTHER): Payer: Self-pay | Admitting: Thoracic Surgery (Cardiothoracic Vascular Surgery)

## 2012-06-16 ENCOUNTER — Ambulatory Visit
Admission: RE | Admit: 2012-06-16 | Discharge: 2012-06-16 | Disposition: A | Discharge status: Home / Self Care | Payer: 59 | Source: Ambulatory Visit | Attending: Thoracic Surgery (Cardiothoracic Vascular Surgery) | Admitting: Thoracic Surgery (Cardiothoracic Vascular Surgery)

## 2012-06-16 VITALS — BP 107/69 | HR 77 | Resp 18 | Ht 72.0 in | Wt 206.0 lb

## 2012-06-16 DIAGNOSIS — I2510 Atherosclerotic heart disease of native coronary artery without angina pectoris: Secondary | ICD-10-CM

## 2012-06-16 DIAGNOSIS — Z951 Presence of aortocoronary bypass graft: Secondary | ICD-10-CM

## 2012-06-16 NOTE — Progress Notes (Signed)
HPI:  [name] returns today for a scheduled postoperative followup visit. He had coronary bypass grafting x5 on 05/22/2012. Postoperatively did well he was started on amiodarone for atrial fibrillation. He converted to sinus rhythm before discharge. He did not require Coumadin. He says that he has been getting dizzy when he stands up quickly and then starts to walk. And he feels like his legs are weak. He is pretty nonspecific about the leg weakness. He still has some incisional discomfort. He is only taking oxycodone at night because he is having difficulty getting comfortable enough to go to sleep.  Past Medical History  Diagnosis Date  . Hypertension   . Diabetes mellitus   . Hyperlipidemia   . Arthritis   . Coronary artery disease   . Stroke 2006  . PVD (peripheral vascular disease)       Current Outpatient Prescriptions  Medication Sig Dispense Refill  . amiodarone (PACERONE) 400 MG tablet Take 200 mg by mouth 2 (two) times daily.      Marland Kitchen aspirin 81 MG EC tablet Take 1 tablet (81 mg total) by mouth daily. Swallow whole.  30 tablet  12  . clopidogrel (PLAVIX) 75 MG tablet Take 1 tablet (75 mg total) by mouth daily.      Marland Kitchen desoximetasone (TOPICORT) 0.25 % cream Apply 0.25 % topically as needed. Rash on legs      . ezetimibe-simvastatin (VYTORIN) 10-20 MG per tablet Take 1 tablet by mouth at bedtime.        . fexofenadine (ALLEGRA) 180 MG tablet Take 180 mg by mouth daily.        . Glucosamine-Chondroit-Vit C-Mn (GLUCOSAMINE CHONDR 1500 COMPLX PO) Take 1 capsule by mouth daily.       Marland Kitchen guaiFENesin-dextromethorphan (ROBITUSSIN DM) 100-10 MG/5ML syrup Take 15 mLs by mouth every 4 (four) hours as needed for cough.  118 mL    . lansoprazole (PREVACID) 15 MG capsule Take 15 mg by mouth daily.        . metFORMIN (GLUMETZA) 500 MG (MOD) 24 hr tablet Take 500 mg by mouth 2 (two) times daily with a meal.        . metoprolol tartrate (LOPRESSOR) 25 MG tablet Take 25 mg by mouth 2 (two) times  daily.      . Multiple Vitamins-Minerals (GNP MEGA MULTI FOR MEN PO) Take by mouth 2 (two) times daily.        Marland Kitchen oxyCODONE (OXY IR/ROXICODONE) 5 MG immediate release tablet Take 1-2 tablets (5-10 mg total) by mouth every 4 (four) hours as needed.  30 tablet  0  . ramipril (ALTACE) 10 MG tablet Take 10 mg by mouth daily.        . sitaGLIPtin (JANUVIA) 100 MG tablet Take 100 mg by mouth daily.        . [DISCONTINUED] amiodarone (PACERONE) 400 MG tablet Take 1 tablet (400 mg total) by mouth 2 (two) times daily. For one week;then take Amiodarone 400 mg po daily thereaftet  90 tablet  1  . [DISCONTINUED] metoprolol tartrate (LOPRESSOR) 25 MG tablet Take 1 tablet (25 mg total) by mouth 3 (three) times daily.  90 tablet  1  . furosemide (LASIX) 40 MG tablet Take 1 tablet (40 mg total) by mouth daily. For 5 days  5 tablet  0  . potassium chloride SA (K-DUR,KLOR-CON) 20 MEQ tablet Take 2 tablets (40 mEq total) by mouth 2 (two) times daily. For 5 days  10 tablet  0  Physical Exam BP 107/69  Pulse 77  Resp 18  Ht 6' (1.829 m)  Wt 206 lb (93.441 kg)  BMI 27.94 kg/m2  SpO72 22% 61 year old male in no acute distress Neurologic alert and oriented x3 with no focal deficits Cardiac regular rate and rhythm normal S1 and S2 no rubs or murmurs Lungs clear with equal breath sounds bilaterally Sternum stable, sternal incision clean dry and intact The leg incision is healing well, no peripheral edema.  Diagnostic Tests: Chest x-ray 06/16/2012 Shows postoperative changes, with a tiny left pleural effusion.  Impression: 61 year old gentleman status post coronary bypass grafting about 3 weeks ago. Overall he is doing well. He does complain of some dizziness when he gets up rapidly. His blood pressure is relatively low. Recommend that he decrease his metoprolol to 25 mg twice daily. He is also going to hold the Altace for about 2 weeks and then restart that if his symptoms have improved.  I advised him not to  try to drive until after the dizziness has resolved. At that point to begin driving a limited basis, appropriate precautions were discussed. He is not to lift any objects over 10 pounds for at least another 3 weeks.   Plan: He will continue to be followed by Dr. Donato Schultz  I will be happy to see him back any time if I can be of any further assistance with his care. He was specifically instructed to call back if he is continuing to have to difficulty with the dizziness or the leg weakness in 2 weeks.

## 2012-07-16 ENCOUNTER — Encounter (HOSPITAL_COMMUNITY)
Admission: RE | Admit: 2012-07-16 | Discharge: 2012-07-16 | Disposition: A | Discharge status: Home / Self Care | Payer: 59 | Source: Ambulatory Visit | Attending: Cardiology | Admitting: Cardiology

## 2012-07-16 DIAGNOSIS — I2510 Atherosclerotic heart disease of native coronary artery without angina pectoris: Secondary | ICD-10-CM | POA: Insufficient documentation

## 2012-07-16 DIAGNOSIS — Z5189 Encounter for other specified aftercare: Secondary | ICD-10-CM | POA: Insufficient documentation

## 2012-07-16 DIAGNOSIS — Z951 Presence of aortocoronary bypass graft: Secondary | ICD-10-CM | POA: Insufficient documentation

## 2012-07-16 NOTE — Progress Notes (Signed)
Cardiac Rehab Medication Review by a Pharmacist  Does the patient  feel that his/her medications are working for him/her?  yes  Has the patient been experiencing any side effects to the medications prescribed?  Yes, dizziness with ramipril and metoprolol combination, MD aware, holding ramipril until course of metoprolol is complete and will restart ramipril  Does the patient measure his/her own blood pressure or blood glucose at home?  yes   Does the patient have any problems obtaining medications due to transportation or finances?   no  Understanding of regimen: excellent Understanding of indications: good Potential of compliance: excellent  Pharmacist comments: Medication list was reviewed for accuracy, indications, adverse effects, and compliance. Any patient questions were addressed at this time.  Abran Duke, PharmD Clinical Pharmacist Phone: [phone] Pager: [phone] 07/16/2012 8:43 AM

## 2012-07-20 ENCOUNTER — Encounter (HOSPITAL_COMMUNITY)
Admission: RE | Admit: 2012-07-20 | Discharge: 2012-07-20 | Disposition: A | Discharge status: Home / Self Care | Payer: 59 | Source: Ambulatory Visit | Attending: Cardiology | Admitting: Cardiology

## 2012-07-20 LAB — GLUCOSE, CAPILLARY: Glucose-Capillary: 112 mg/dL — ABNORMAL HIGH (ref 70–99)

## 2012-07-20 NOTE — Progress Notes (Signed)
Pt started cardiac rehab today.  Pt tolerated light exercise without difficulty. Telemetry rhythm Sinus without ectopy, Vital signs stable. Will continue to monitor the patient throughout  the program.

## 2012-07-24 ENCOUNTER — Encounter (HOSPITAL_COMMUNITY)
Admission: RE | Admit: 2012-07-24 | Discharge: 2012-07-24 | Disposition: A | Discharge status: Home / Self Care | Payer: 59 | Source: Ambulatory Visit | Attending: Cardiology | Admitting: Cardiology

## 2012-07-24 LAB — GLUCOSE, CAPILLARY: Glucose-Capillary: 164 mg/dL — ABNORMAL HIGH (ref 70–99)

## 2012-07-27 ENCOUNTER — Encounter (HOSPITAL_COMMUNITY)
Admission: RE | Admit: 2012-07-27 | Discharge: 2012-07-27 | Disposition: A | Discharge status: Home / Self Care | Payer: 59 | Source: Ambulatory Visit | Attending: Cardiology | Admitting: Cardiology

## 2012-07-27 LAB — GLUCOSE, CAPILLARY
Glucose-Capillary: 175 mg/dL — ABNORMAL HIGH (ref 70–99)
Glucose-Capillary: 108 mg/dL — ABNORMAL HIGH (ref 70–99)

## 2012-07-27 NOTE — Progress Notes (Signed)
Reviewed home exercise with pt today as pt is going on a cruise next week.  Pt plans to walk and to attend [employer] for exercise.  Reviewed THR, pulse, RPE, sign and symptoms, NTG use, and when to call 911 or MD.  Pt voiced understanding. Fabio Pierce, MA, ACSM RCEP

## 2012-07-31 ENCOUNTER — Encounter (HOSPITAL_COMMUNITY)
Admission: RE | Admit: 2012-07-31 | Discharge: 2012-07-31 | Disposition: A | Discharge status: Home / Self Care | Payer: 59 | Source: Ambulatory Visit | Attending: Cardiology | Admitting: Cardiology

## 2012-07-31 DIAGNOSIS — Z951 Presence of aortocoronary bypass graft: Secondary | ICD-10-CM | POA: Insufficient documentation

## 2012-07-31 DIAGNOSIS — Z5189 Encounter for other specified aftercare: Secondary | ICD-10-CM | POA: Insufficient documentation

## 2012-07-31 DIAGNOSIS — I2510 Atherosclerotic heart disease of native coronary artery without angina pectoris: Secondary | ICD-10-CM | POA: Insufficient documentation

## 2012-07-31 LAB — GLUCOSE, CAPILLARY: Glucose-Capillary: 120 mg/dL — ABNORMAL HIGH (ref 70–99)

## 2012-08-03 ENCOUNTER — Encounter (HOSPITAL_COMMUNITY): Payer: 59

## 2012-08-05 ENCOUNTER — Encounter (HOSPITAL_COMMUNITY): Payer: 59

## 2012-08-07 ENCOUNTER — Encounter (HOSPITAL_COMMUNITY): Payer: 59

## 2012-08-10 ENCOUNTER — Encounter (HOSPITAL_COMMUNITY): Payer: 59

## 2012-08-12 ENCOUNTER — Encounter (HOSPITAL_COMMUNITY): Payer: 59

## 2012-08-14 ENCOUNTER — Encounter (HOSPITAL_COMMUNITY): Payer: 59

## 2012-08-17 ENCOUNTER — Encounter (HOSPITAL_COMMUNITY): Payer: 59

## 2012-08-19 ENCOUNTER — Encounter (HOSPITAL_COMMUNITY): Payer: 59

## 2012-08-21 ENCOUNTER — Encounter (HOSPITAL_COMMUNITY): Payer: 59

## 2012-08-24 ENCOUNTER — Encounter (HOSPITAL_COMMUNITY): Payer: 59

## 2012-08-26 ENCOUNTER — Encounter (HOSPITAL_COMMUNITY): Payer: 59

## 2012-08-28 ENCOUNTER — Telehealth (HOSPITAL_COMMUNITY): Payer: Self-pay | Admitting: *Deleted

## 2012-08-28 ENCOUNTER — Encounter (HOSPITAL_COMMUNITY): Payer: 59

## 2012-08-31 ENCOUNTER — Encounter (HOSPITAL_COMMUNITY): Payer: 59

## 2012-09-02 ENCOUNTER — Encounter (HOSPITAL_COMMUNITY): Payer: 59

## 2012-09-04 ENCOUNTER — Encounter (HOSPITAL_COMMUNITY): Payer: 59

## 2012-09-07 ENCOUNTER — Encounter (HOSPITAL_COMMUNITY): Payer: 59

## 2012-09-09 ENCOUNTER — Encounter (HOSPITAL_COMMUNITY): Payer: 59

## 2012-09-11 ENCOUNTER — Encounter (HOSPITAL_COMMUNITY): Payer: 59

## 2012-09-14 ENCOUNTER — Encounter (HOSPITAL_COMMUNITY): Payer: 59

## 2012-09-16 ENCOUNTER — Encounter (HOSPITAL_COMMUNITY): Payer: 59

## 2012-09-18 ENCOUNTER — Encounter (HOSPITAL_COMMUNITY): Payer: 59

## 2012-09-21 ENCOUNTER — Encounter (HOSPITAL_COMMUNITY): Payer: 59

## 2012-09-23 ENCOUNTER — Encounter (HOSPITAL_COMMUNITY): Payer: 59

## 2012-09-25 ENCOUNTER — Encounter (HOSPITAL_COMMUNITY): Payer: 59

## 2012-09-28 ENCOUNTER — Encounter (HOSPITAL_COMMUNITY): Payer: 59

## 2012-09-30 ENCOUNTER — Encounter (HOSPITAL_COMMUNITY): Payer: 59

## 2012-10-02 ENCOUNTER — Encounter (HOSPITAL_COMMUNITY): Payer: 59

## 2012-10-05 ENCOUNTER — Encounter (HOSPITAL_COMMUNITY): Payer: 59

## 2012-10-07 ENCOUNTER — Encounter (HOSPITAL_COMMUNITY): Payer: 59

## 2012-10-09 ENCOUNTER — Encounter (HOSPITAL_COMMUNITY): Payer: 59

## 2012-10-12 ENCOUNTER — Encounter (HOSPITAL_COMMUNITY): Payer: 59

## 2012-10-14 ENCOUNTER — Encounter (HOSPITAL_COMMUNITY): Payer: 59

## 2012-10-16 ENCOUNTER — Encounter (HOSPITAL_COMMUNITY): Payer: 59

## 2012-10-19 ENCOUNTER — Encounter (HOSPITAL_COMMUNITY): Payer: 59

## 2012-10-21 ENCOUNTER — Encounter (HOSPITAL_COMMUNITY): Payer: 59

## 2012-10-23 ENCOUNTER — Encounter (HOSPITAL_COMMUNITY): Payer: 59

## 2013-03-03 ENCOUNTER — Ambulatory Visit: Payer: 59 | Admitting: Neurosurgery

## 2013-03-03 ENCOUNTER — Encounter (INDEPENDENT_AMBULATORY_CARE_PROVIDER_SITE_OTHER): Payer: 59 | Admitting: *Deleted

## 2013-03-03 ENCOUNTER — Other Ambulatory Visit (INDEPENDENT_AMBULATORY_CARE_PROVIDER_SITE_OTHER): Payer: 59 | Admitting: *Deleted

## 2013-03-03 DIAGNOSIS — I6529 Occlusion and stenosis of unspecified carotid artery: Secondary | ICD-10-CM

## 2013-03-03 DIAGNOSIS — Z48812 Encounter for surgical aftercare following surgery on the circulatory system: Secondary | ICD-10-CM

## 2013-03-03 DIAGNOSIS — I771 Stricture of artery: Secondary | ICD-10-CM

## 2013-03-03 DIAGNOSIS — G458 Other transient cerebral ischemic attacks and related syndromes: Secondary | ICD-10-CM

## 2013-03-09 ENCOUNTER — Encounter: Payer: Self-pay | Admitting: Vascular Surgery

## 2013-03-09 ENCOUNTER — Other Ambulatory Visit: Payer: Self-pay | Admitting: *Deleted

## 2013-03-09 DIAGNOSIS — Z48812 Encounter for surgical aftercare following surgery on the circulatory system: Secondary | ICD-10-CM

## 2013-04-19 IMAGING — CR DG CHEST 2V
2 series · 2 of 2 positions shown · non-contrast
Comparison: May 26, 2012.

CLINICAL DATA: Status post coronary artery bypass graft.

CHEST - 2 VIEW

[w chest pa]
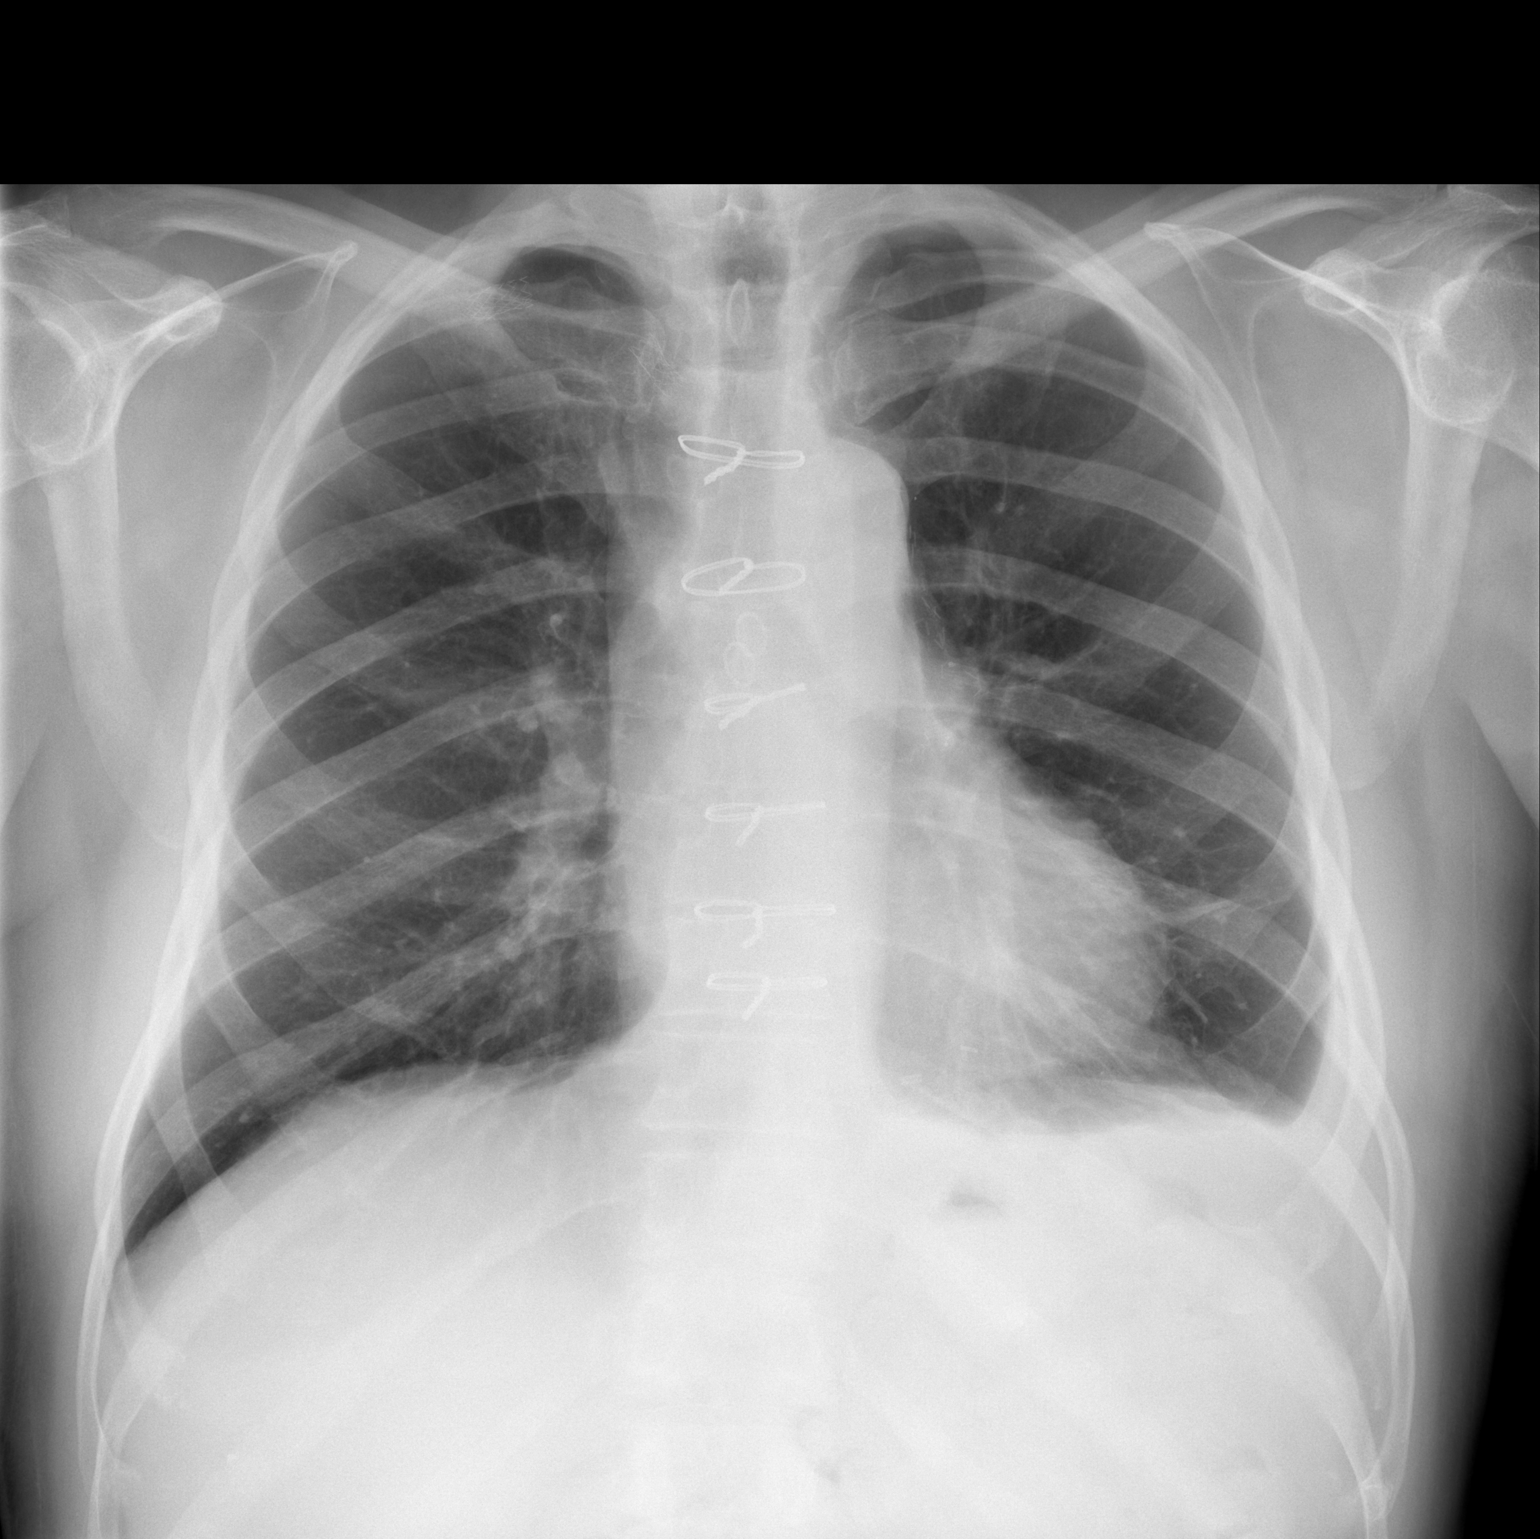

[w chest lat]
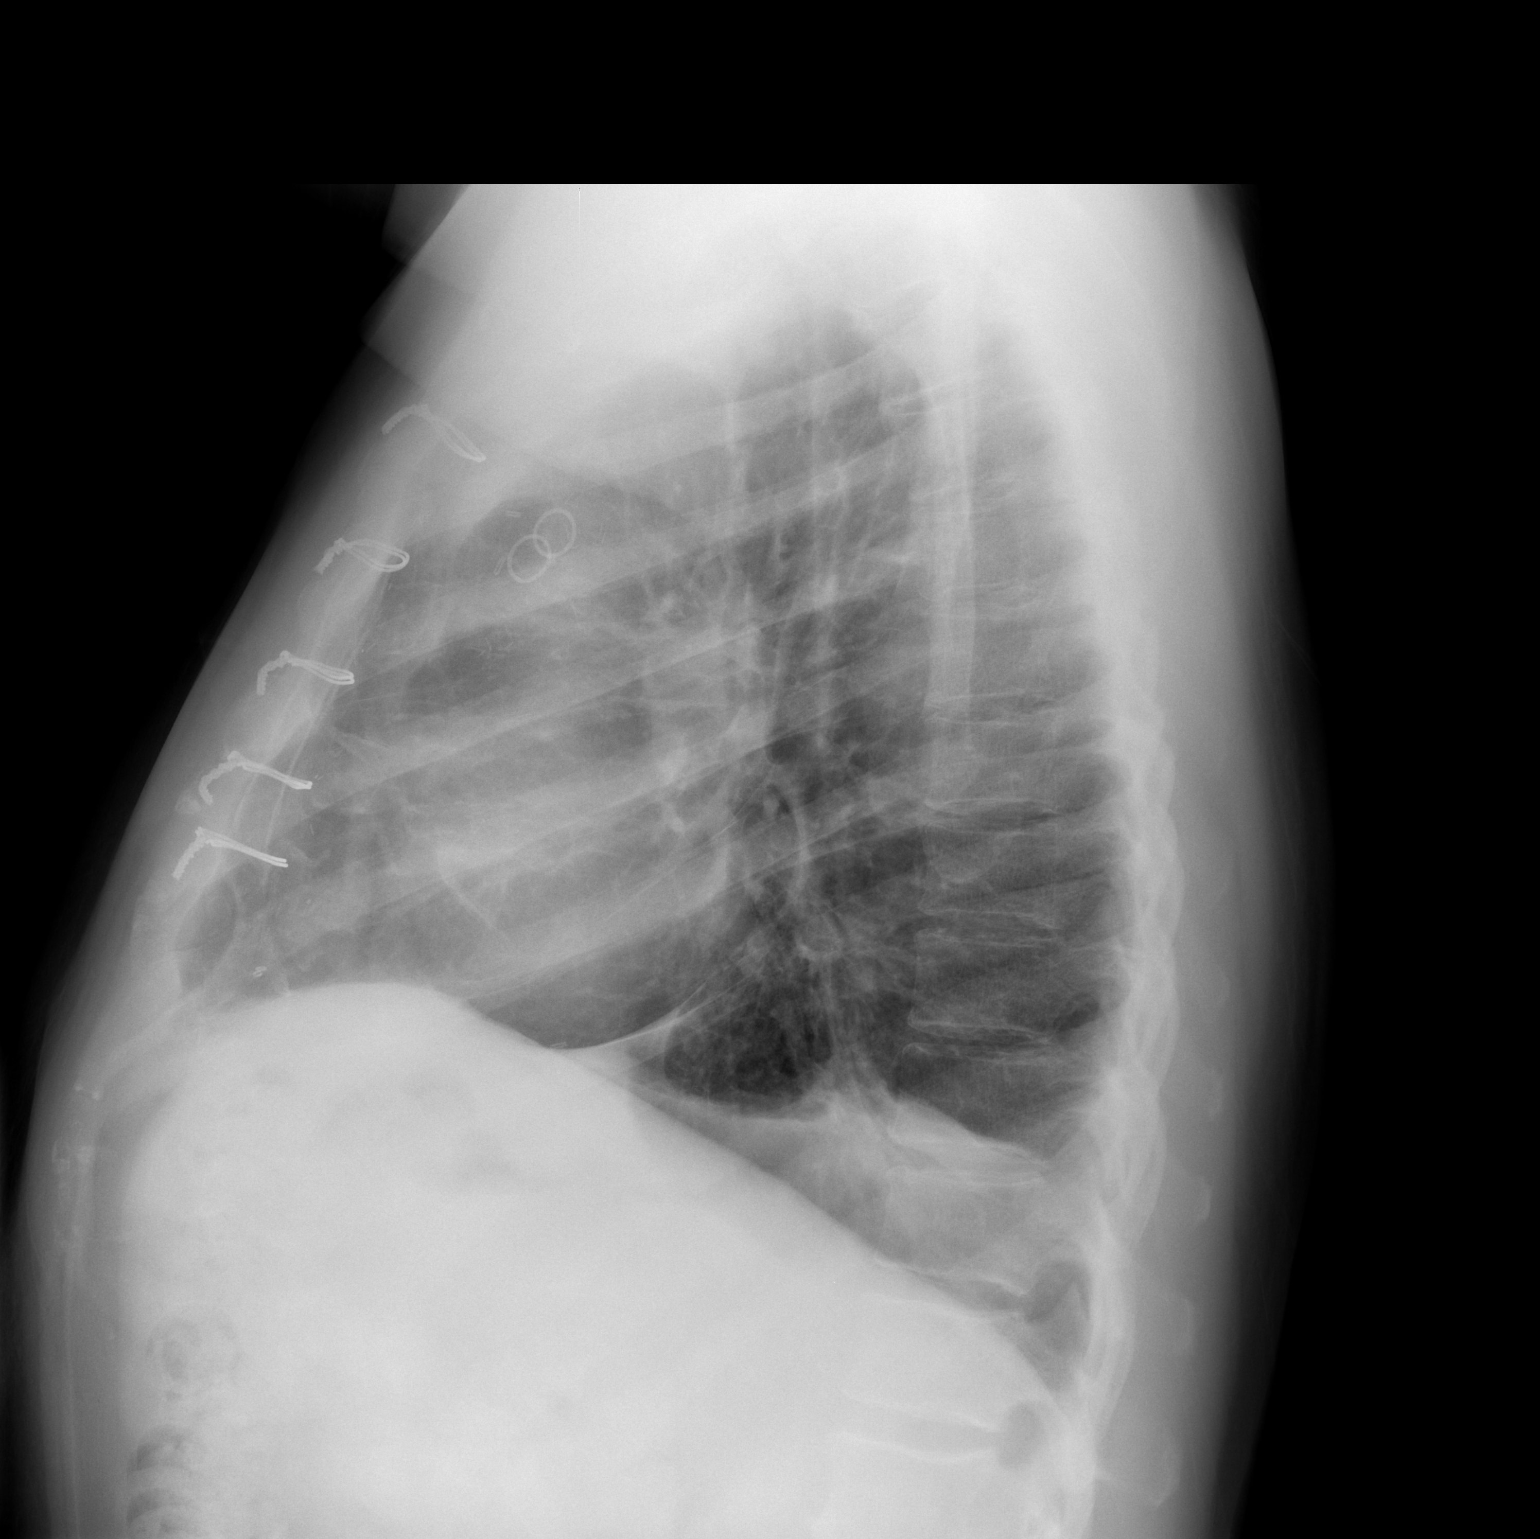

[2 of 2 positions shown; findings below may reference images not displayed]

FINDINGS: Sternotomy wires are noted.  The right lung is clear.
Left basilar opacity is significantly improved compared to prior
exam consistent with scarring or possibly mild pleural effusion.
IMPRESSION: Improved left basilar opacity compared to prior exam consistent
with scarring or residual effusion.

## 2013-05-20 ENCOUNTER — Ambulatory Visit (INDEPENDENT_AMBULATORY_CARE_PROVIDER_SITE_OTHER): Payer: 59 | Admitting: Cardiology

## 2013-05-20 ENCOUNTER — Encounter: Payer: Self-pay | Admitting: Cardiology

## 2013-05-20 VITALS — BP 118/80 | HR 78 | Ht 72.0 in | Wt 219.1 lb

## 2013-05-20 DIAGNOSIS — Z951 Presence of aortocoronary bypass graft: Secondary | ICD-10-CM

## 2013-05-20 DIAGNOSIS — E1159 Type 2 diabetes mellitus with other circulatory complications: Secondary | ICD-10-CM

## 2013-05-20 DIAGNOSIS — I739 Peripheral vascular disease, unspecified: Secondary | ICD-10-CM

## 2013-05-20 DIAGNOSIS — M069 Rheumatoid arthritis, unspecified: Secondary | ICD-10-CM | POA: Insufficient documentation

## 2013-05-20 DIAGNOSIS — I2510 Atherosclerotic heart disease of native coronary artery without angina pectoris: Secondary | ICD-10-CM

## 2013-05-20 HISTORY — DX: Rheumatoid arthritis, unspecified: M06.9

## 2013-05-20 HISTORY — DX: Type 2 diabetes mellitus with other circulatory complications: E11.59

## 2013-05-20 MED ORDER — RAMIPRIL 2.5 MG PO CAPS: 2.5000 mg | ORAL_CAPSULE | Freq: Every day | ORAL | Status: DC

## 2013-05-20 NOTE — Progress Notes (Signed)
[address], [address] [place], [place]  [address] Phone: [phone] Fax:  [phone]  Date:  05/20/2013   ID:  Colin Parks, DOB 1950-08-13, MRN [id number]  PCP:  Georgann Housekeeper, MD   History of Present Illness: Colin Parks is a 62 y.o. male , severe status post CABG, peripheral vascular disease status post right subclavian stenosis with stent placed in 2012, bilateral carotid endarterectomy in 2006 and 2009, prior stroke with mild right-sided weakness, foot,  here for followup.  On 01/04/13 he saw Dr. Katrinka Blazing in clinic and a stress test was performed which demonstrated no evidence of ischemia. Atypical nature of chest pain and it is similar to prior chest discomfort. He had been taking Plavix for stroke. Prior chest pain was described as mid left clavicular lower chest discomfort that radiates the left arm that is nonexertional. Lasted 3-10 minutes. Palpitations, no dizziness. EKG at that time showed normal sinus rhythm with PACs and nonspecific ST-T wave changes.  Diabetes, hypertension, hyperlipidemia or all controlled secondarily with medications.  RA currently taking Remicade. Steroids cause increase in weight.  Overall, he is doing very well without any exertional anginal symptoms, no strokelike symptoms. Feeling well. Compliant with his medications. We discussed starting ACE inhibitor, low-dose.   Wt Readings from Last 3 Encounters:  05/20/13 219 lb 1.9 oz (99.392 kg)  07/16/12 211 lb 10.3 oz (96 kg)  06/16/12 206 lb (93.441 kg)     Past Medical History  Diagnosis Date  . Hypertension   . Diabetes mellitus   . Hyperlipidemia   . Arthritis   . Coronary artery disease   . Stroke 2006  . PVD (peripheral vascular disease)     Past Surgical History  Procedure Laterality Date  . Angioplasty  05/10/2005    Dacron patch  . Cardiac catheterization  02/06/2010    Aortic Arch - Second order catheterization (right subclavian artery)  . Carotid stent  02/06/2010      right subclavian artery  . Hand surgery  right hand cyst removed from knuckle per pt.    2008  . Knee surgery  left knee 2004  . Ankle surgery  left ankle 2002  . Carotid endarterectomy  05/10/2005    Left  . Carotid endarterectomy  2009    Dr. Arbie Cookey  . Coronary artery bypass graft  05/22/2012    Procedure: CORONARY ARTERY BYPASS GRAFTING (CABG);  Surgeon: Loreli Slot, MD;  Location: [facility] OR;  Service: Open Heart Surgery;  Laterality: N/A;  times five, using Left Internal Mammary Artery and Right Greater Saphenous Vein Graft harvested Endoscopically  . Endovein harvest of greater saphenous vein  05/22/2012    Procedure: ENDOVEIN HARVEST OF GREATER SAPHENOUS VEIN;  Surgeon: Loreli Slot, MD;  Location: [facility] OR;  Service: Open Heart Surgery;  Laterality: Right;    Current Outpatient Prescriptions  Medication Sig Dispense Refill  . acetaminophen (TYLENOL) 500 MG tablet Take 500 mg by mouth every 6 (six) hours as needed.      Marland Kitchen aspirin 81 MG EC tablet Take 1 tablet (81 mg total) by mouth daily. Swallow whole.  30 tablet  12  . clopidogrel (PLAVIX) 75 MG tablet Take 1 tablet (75 mg total) by mouth daily.      Marland Kitchen desoximetasone (TOPICORT) 0.25 % cream Apply 0.25 % topically as needed. Rash on legs      . ezetimibe-simvastatin (VYTORIN) 10-20 MG per tablet Take 1 tablet by mouth at bedtime.        Marland Kitchen  fexofenadine (ALLEGRA) 180 MG tablet Take 180 mg by mouth daily.        . Glucosamine-Chondroit-Vit C-Mn (GLUCOSAMINE CHONDR 1500 COMPLX PO) Take 1 capsule by mouth daily.       . lansoprazole (PREVACID) 15 MG capsule Take 15 mg by mouth daily.        . metFORMIN (GLUMETZA) 500 MG (MOD) 24 hr tablet Take 500 mg by mouth 3 (three) times daily with meals.       . metoprolol tartrate (LOPRESSOR) 25 MG tablet Take 25 mg by mouth 2 (two) times daily.      . Multiple Vitamins-Minerals (GNP MEGA MULTI FOR MEN PO) Take by mouth 2 (two) times daily.        . ramipril (ALTACE) 10 MG tablet Take  10 mg by mouth daily. To start taking in about 10 days after stopping metoprolol, severe dizziness with both together      . REMICADE 100 MG injection Inject 100 mg into the vein every 8 (eight) weeks.       . sitaGLIPtin (JANUVIA) 100 MG tablet Take 100 mg by mouth daily.         No current facility-administered medications for this visit.    Allergies:    Allergies  Allergen Reactions  . Codeine Nausea And Vomiting    Social History:  The patient  reports that he quit smoking about 41 years ago. He does not have any smokeless tobacco history on file. He reports that he drinks alcohol. He reports that he does not use illicit drugs.   ROS:  Please see the history of present illness.   Denies claudication, syncope, chest pain, orthopnea.    PHYSICAL EXAM: VS:  BP 118/80  Pulse 78  Ht 6' (1.829 m)  Wt 219 lb 1.9 oz (99.392 kg)  BMI 29.71 kg/m2 Well nourished, well developed, in no acute distress HEENT: normalI do not appreciate any bruits, prior endarterectomy scars noted Neck: no JVD Cardiac:  normal S1, S2; RRR; no murmur Lungs:  clear to auscultation bilaterally, no wheezing, rhonchi or rales Abd: soft, nontender, no hepatomegalyOverweight Ext: no edema Skin: warm and dry Neuro: no focal abnormalities noted  Laboratory performed in Dr. Venita Sheffield visit   EKG: Sinus rhythm with PACs, nonspecific ST-T wave changes   ASSESSMENT AND PLAN:  1. Coronary artery disease-currently stable, no exertional anginal symptoms. Doing well. 2. Angina-currently very well controlled. Prior chest pain likely atypical. Stress test reassuring June 2014. 3. Overweight-he has lost approximately 10 pounds recently after gaining 20 with steroid from with arthritis. Continue to advocate good diet and exercise. 4. Hypertension-currently very well controlled. I am adding back low-dose ACE inhibitor,  2.5 mg once a day for renal protection as well as heart protection. If dizzy, may need to decrease  metoprolol from 25 mg twice a day down to 12.5 mg twice a day. 5. Peripheral vascular disease-currently stable, subclavian, carotids. Dr. early. Plavix. 6. Prior stroke-mild residual right foot weakness especially with stairs. Plavix. Aspirin. 7. Prior bypass. 10/25/13x5. Brief postoperative amiodarone/atrial fibrillation. 8. Rheumatoid arthritis-Remicade. Rheumatology. 9. Diabetes with peripheral vascular disease-Dr. Donette Larry.  Signed, Donato Schultz, MD [facility]  05/20/2013 9:30 AM

## 2013-05-20 NOTE — Patient Instructions (Signed)
Your physician has recommended you make the following change in your medication:   1. Decrease Ramipril to 2.5 mg once daily.  Your physician wants you to follow-up in: 6 months with Dr. Anne Fu. You will receive a reminder letter in the mail two months in advance. If you don't receive a letter, please call our office to schedule the follow-up appointment.

## 2013-06-03 ENCOUNTER — Other Ambulatory Visit: Payer: Self-pay

## 2013-09-15 ENCOUNTER — Other Ambulatory Visit: Payer: Self-pay | Admitting: Vascular Surgery

## 2013-09-15 DIAGNOSIS — Z48812 Encounter for surgical aftercare following surgery on the circulatory system: Secondary | ICD-10-CM

## 2013-09-15 DIAGNOSIS — I6529 Occlusion and stenosis of unspecified carotid artery: Secondary | ICD-10-CM

## 2013-11-16 ENCOUNTER — Encounter: Payer: Self-pay | Admitting: Cardiology

## 2013-11-16 ENCOUNTER — Ambulatory Visit (INDEPENDENT_AMBULATORY_CARE_PROVIDER_SITE_OTHER): Payer: 59 | Admitting: Cardiology

## 2013-11-16 ENCOUNTER — Other Ambulatory Visit: Payer: Self-pay | Admitting: Cardiology

## 2013-11-16 VITALS — BP 143/78 | HR 92 | Ht 72.0 in | Wt 221.0 lb

## 2013-11-16 DIAGNOSIS — M069 Rheumatoid arthritis, unspecified: Secondary | ICD-10-CM

## 2013-11-16 DIAGNOSIS — Z951 Presence of aortocoronary bypass graft: Secondary | ICD-10-CM

## 2013-11-16 DIAGNOSIS — I739 Peripheral vascular disease, unspecified: Secondary | ICD-10-CM

## 2013-11-16 DIAGNOSIS — I2510 Atherosclerotic heart disease of native coronary artery without angina pectoris: Secondary | ICD-10-CM

## 2013-11-16 NOTE — Progress Notes (Signed)
[facility]. [address], [place], [place]  [address] Phone: [phone] Fax:  [phone]  Date:  11/16/2013   ID:  Colin Parks, DOB 08-Nov-1950, MRN [id number]  PCP:  Wenda Low, MD   History of Present Illness: Colin Parks is a 63 y.o. male , severe CAD status post CABG, peripheral vascular disease status post right subclavian stenosis with stent placed in 2012, bilateral carotid endarterectomy in 2006 and 2009, prior stroke with mild right-sided weakness, foot,  here for followup.  On 01/04/13 he saw Dr. Tamala Julian in clinic and a stress test was performed which demonstrated no evidence of ischemia. Atypical nature of chest pain and it is similar to prior chest discomfort. He had been taking Plavix for stroke. Prior chest pain was described as mid left clavicular lower chest discomfort that radiates the left arm that is nonexertional. Lasted 3-10 minutes. Palpitations, no dizziness. EKG at that time showed normal sinus rhythm with PACs and nonspecific ST-T wave changes.  Diabetes, hypertension, hyperlipidemia or all controlled secondarily with medications.  RA currently taking Remicade. Steroids cause increase in weight.  Overall, he is doing very well without any exertional anginal symptoms, no strokelike symptoms. Feeling well. Compliant with his medications.  Wt Readings from Last 3 Encounters:  11/16/13 221 lb (100.245 kg)  05/20/13 219 lb 1.9 oz (99.392 kg)  07/16/12 211 lb 10.3 oz (96 kg)     Past Medical History  Diagnosis Date  . Hypertension   . Diabetes mellitus   . Hyperlipidemia   . Arthritis   . Coronary artery disease   . Stroke 2006  . PVD (peripheral vascular disease)   . Type II or unspecified type diabetes mellitus with peripheral circulatory disorders, not stated as uncontrolled(250.70) 05/20/2013  . RA (rheumatoid arthritis) 05/20/2013    Past Surgical History  Procedure Laterality Date  . Angioplasty  05/10/2005    Dacron patch  . Cardiac  catheterization  02/06/2010    Aortic Arch - Second order catheterization (right subclavian artery)  . Carotid stent  02/06/2010    right subclavian artery  . Hand surgery  right hand cyst removed from knuckle per pt.    2008  . Knee surgery  left knee 2004  . Ankle surgery  left ankle 2002  . Carotid endarterectomy  05/10/2005    Left  . Carotid endarterectomy  2009    Dr. Donnetta Hutching  . Coronary artery bypass graft  05/22/2012    Procedure: CORONARY ARTERY BYPASS GRAFTING (CABG);  Surgeon: Melrose Nakayama, MD;  Location: [place] [facility];  Service: Open Heart Surgery;  Laterality: N/A;  times five, using Left Internal Mammary Artery and Right Greater Saphenous Vein Graft harvested Endoscopically  . Endovein harvest of greater saphenous vein  05/22/2012    Procedure: ENDOVEIN HARVEST OF GREATER SAPHENOUS VEIN;  Surgeon: Melrose Nakayama, MD;  Location: [place];  Service: Open Heart Surgery;  Laterality: Right;    Current Outpatient Prescriptions  Medication Sig Dispense Refill  . acetaminophen (TYLENOL) 500 MG tablet Take 500 mg by mouth every 6 (six) hours as needed.      Marland Kitchen aspirin 81 MG EC tablet Take 1 tablet (81 mg total) by mouth daily. Swallow whole.  30 tablet  12  . clopidogrel (PLAVIX) 75 MG tablet Take 1 tablet (75 mg total) by mouth daily.      Marland Kitchen desoximetasone (TOPICORT) 0.25 % cream Apply 0.25 % topically as needed. Rash on legs      .  ezetimibe-simvastatin (VYTORIN) 10-20 MG per tablet Take 1 tablet by mouth at bedtime.        . fexofenadine (ALLEGRA) 180 MG tablet Take 180 mg by mouth daily.        Marland Kitchen FORMIC ACID EX Apply topically.      . Glucosamine-Chondroit-Vit C-Mn (GLUCOSAMINE CHONDR 1500 COMPLX PO) Take 1 capsule by mouth daily.       . lansoprazole (PREVACID) 15 MG capsule Take 15 mg by mouth daily.        . metFORMIN (GLUMETZA) 500 MG (MOD) 24 hr tablet Take 500 mg by mouth 3 (three) times daily with meals.       . METHOTREXATE, ANTI-RHEUMATIC, PO Take 1 tablet by mouth  daily.      . metoprolol tartrate (LOPRESSOR) 25 MG tablet Take 25 mg by mouth 2 (two) times daily.      . Multiple Vitamins-Minerals (GNP MEGA MULTI FOR MEN PO) Take by mouth 2 (two) times daily.        . ramipril (ALTACE) 2.5 MG capsule Take 1 capsule (2.5 mg total) by mouth daily.  90 capsule  2  . REMICADE 100 MG injection Inject 100 mg into the vein every 8 (eight) weeks.       . sitaGLIPtin (JANUVIA) 100 MG tablet Take 100 mg by mouth daily.         No current facility-administered medications for this visit.    Allergies:    Allergies  Allergen Reactions  . Codeine Nausea And Vomiting    Social History:  The patient  reports that he quit smoking about 42 years ago. He does not have any smokeless tobacco history on file. He reports that he drinks alcohol. He reports that he does not use illicit drugs. 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 Enjoys cruises. [place].  ROS:  Please see the history of present illness.   Denies claudication, syncope, chest pain, orthopnea.    PHYSICAL EXAM: VS:  BP 143/78  Pulse 92  Ht 6' (1.829 m)  Wt 221 lb (100.245 kg)  BMI 29.97 kg/m2 Well nourished, well developed, in no acute distress HEENT: normalI do not appreciate any bruits, prior endarterectomy scars noted Neck: no JVD Cardiac:  normal S1, S2; RRR; no murmur Lungs:  clear to auscultation bilaterally, no wheezing, rhonchi or rales Abd: soft, nontender, no hepatomegalyOverweight Ext: no edema Skin: warm and dry Neuro: no focal abnormalities noted  Laboratory performed in Dr. Glenna Durand visit   EKG: 11/16/13 -  Sinus rhythm, nonspecific ST-T wave changes   ASSESSMENT AND PLAN:  1. Coronary artery disease-currently stable, no exertional anginal symptoms. Doing well. 2. Angina-currently very well controlled. Prior chest pain likely atypical. Stress test reassuring June 2014. 3. Overweight-he has lost approximately 10 pounds recently after gaining 20 with steroid from with  arthritis. Continue to advocate good diet and exercise. 4. Hypertension-currently mildly elevated but usually controlled. Ramipril 2.5 mg once a day for renal protection as well as heart protection. If dizzy, may need to decrease metoprolol from 25 mg twice a day down to 12.5 mg twice a day. 5. Peripheral vascular disease-currently stable, subclavian, carotids. Dr. Donnetta Hutching. Plavix. 6. Prior stroke-mild residual right foot weakness especially with stairs. Plavix. Aspirin. 7. Prior bypass. 10/25/13x5. Brief postoperative amiodarone/atrial fibrillation. 8. Rheumatoid arthritis-Remicade. Rheumatology. 9. Diabetes with peripheral vascular disease-Dr. Lysle Rubens. 10. 1 year follow up.  Signed, Candee Furbish, MD [facility]  11/16/2013 4:17 PM

## 2013-11-16 NOTE — Patient Instructions (Signed)
Your physician recommends that you continue on your current medications as directed. Please refer to the Current Medication list given to you today.  Your physician wants you to follow-up in: 1 year with Dr. Skains. You will receive a reminder letter in the mail two months in advance. If you don't receive a letter, please call our office to schedule the follow-up appointment.  

## 2014-01-22 ENCOUNTER — Other Ambulatory Visit: Payer: Self-pay | Admitting: Cardiology

## 2014-03-07 ENCOUNTER — Encounter: Payer: Self-pay | Admitting: Vascular Surgery

## 2014-03-08 ENCOUNTER — Ambulatory Visit (INDEPENDENT_AMBULATORY_CARE_PROVIDER_SITE_OTHER): Payer: 59 | Admitting: Vascular Surgery

## 2014-03-08 ENCOUNTER — Encounter: Payer: Self-pay | Admitting: Vascular Surgery

## 2014-03-08 ENCOUNTER — Ambulatory Visit (HOSPITAL_COMMUNITY)
Admission: RE | Admit: 2014-03-08 | Discharge: 2014-03-08 | Disposition: A | Discharge status: Home / Self Care | Payer: 59 | Source: Ambulatory Visit | Attending: Vascular Surgery | Admitting: Vascular Surgery

## 2014-03-08 VITALS — BP 135/72 | HR 85 | Ht 72.0 in | Wt 214.6 lb

## 2014-03-08 DIAGNOSIS — Z7982 Long term (current) use of aspirin: Secondary | ICD-10-CM | POA: Diagnosis not present

## 2014-03-08 DIAGNOSIS — Z48812 Encounter for surgical aftercare following surgery on the circulatory system: Secondary | ICD-10-CM

## 2014-03-08 DIAGNOSIS — Z9889 Other specified postprocedural states: Secondary | ICD-10-CM | POA: Insufficient documentation

## 2014-03-08 DIAGNOSIS — I6529 Occlusion and stenosis of unspecified carotid artery: Secondary | ICD-10-CM

## 2014-03-08 DIAGNOSIS — E1159 Type 2 diabetes mellitus with other circulatory complications: Secondary | ICD-10-CM | POA: Diagnosis not present

## 2014-03-08 DIAGNOSIS — E785 Hyperlipidemia, unspecified: Secondary | ICD-10-CM | POA: Insufficient documentation

## 2014-03-08 DIAGNOSIS — Z87891 Personal history of nicotine dependence: Secondary | ICD-10-CM | POA: Diagnosis not present

## 2014-03-08 DIAGNOSIS — Z8673 Personal history of transient ischemic attack (TIA), and cerebral infarction without residual deficits: Secondary | ICD-10-CM | POA: Insufficient documentation

## 2014-03-08 DIAGNOSIS — I2510 Atherosclerotic heart disease of native coronary artery without angina pectoris: Secondary | ICD-10-CM | POA: Diagnosis not present

## 2014-03-08 DIAGNOSIS — Z7901 Long term (current) use of anticoagulants: Secondary | ICD-10-CM | POA: Insufficient documentation

## 2014-03-08 DIAGNOSIS — Z8249 Family history of ischemic heart disease and other diseases of the circulatory system: Secondary | ICD-10-CM | POA: Diagnosis not present

## 2014-03-08 DIAGNOSIS — I10 Essential (primary) hypertension: Secondary | ICD-10-CM | POA: Diagnosis not present

## 2014-03-08 NOTE — Progress Notes (Signed)
The patient presents today for followup of extracranial cerebrovascular disease. He underwent left carotid endarterectomy 2006, right carotid endarterectomy 2008 and right subclavian artery stenting in 2011. He remains asymptomatic. He did have a preoperative stroke. This was non-debilitating to him. He also has extensive coronary disease reports that he is stable from this as well. He has a recent diagnosis of rheumatoid arthritis and this is been somewhat debilitating to him that he is having effective treatment.  Past Medical History  Diagnosis Date  . Hypertension   . Diabetes mellitus   . Hyperlipidemia   . Arthritis   . Coronary artery disease   . Stroke 2006  . PVD (peripheral vascular disease)   . Type II or unspecified type diabetes mellitus with peripheral circulatory disorders, not stated as uncontrolled(250.70) 05/20/2013  . RA (rheumatoid arthritis) 05/20/2013  . Carotid artery occlusion   . Atrial fibrillation     History  Substance Use Topics  . Smoking status: Former Smoker    Quit date: 08/07/1971  . Smokeless tobacco: Not on file  . Alcohol Use: Yes     Comment: one beer once a month    Family History  Problem Relation Age of Onset  . Heart disease Mother   . Hypertension Mother   . Hyperlipidemia Mother   . Heart disease Father   . Vision loss Sister   . Peripheral vascular disease Brother     Allergies  Allergen Reactions  . Codeine Nausea And Vomiting    Current outpatient prescriptions:acetaminophen (TYLENOL) 500 MG tablet, Take 500 mg by mouth every 6 (six) hours as needed., Disp: , Rfl: ;  aspirin 81 MG EC tablet, Take 1 tablet (81 mg total) by mouth daily. Swallow whole., Disp: 30 tablet, Rfl: 12;  clopidogrel (PLAVIX) 75 MG tablet, Take 1 tablet (75 mg total) by mouth daily., Disp: , Rfl: ;  desoximetasone (TOPICORT) 0.25 % cream, Apply 0.25 % topically as needed. Rash on legs, Disp: , Rfl:  ezetimibe-simvastatin (VYTORIN) 10-20 MG per tablet, Take  1 tablet by mouth at bedtime.  , Disp: , Rfl: ;  fexofenadine (ALLEGRA) 180 MG tablet, Take 180 mg by mouth daily.  , Disp: , Rfl: ;  FORMIC ACID EX, Apply topically., Disp: , Rfl: ;  Glucosamine-Chondroit-Vit C-Mn (GLUCOSAMINE CHONDR 1500 COMPLX PO), Take 1 capsule by mouth daily. , Disp: , Rfl: ;  lansoprazole (PREVACID) 15 MG capsule, Take 15 mg by mouth daily.  , Disp: , Rfl:  metFORMIN (GLUMETZA) 500 MG (MOD) 24 hr tablet, Take 500 mg by mouth 3 (three) times daily with meals. , Disp: , Rfl: ;  METHOTREXATE, ANTI-RHEUMATIC, PO, Inject into the muscle daily. , Disp: , Rfl: ;  metoprolol tartrate (LOPRESSOR) 25 MG tablet, TAKE 1 TABLET TWICE A DAY, Disp: 180 tablet, Rfl: 2;  Multiple Vitamins-Minerals (GNP MEGA MULTI FOR MEN PO), Take by mouth 2 (two) times daily.  , Disp: , Rfl:  ramipril (ALTACE) 2.5 MG capsule, TAKE 1 CAPSULE DAILY, Disp: 90 capsule, Rfl: 1;  REMICADE 100 MG injection, Inject 100 mg into the vein every 8 (eight) weeks. , Disp: , Rfl: ;  sitaGLIPtin (JANUVIA) 100 MG tablet, Take 100 mg by mouth daily.  , Disp: , Rfl:   BP 135/72  Pulse 85  Ht 6' (1.829 m)  Wt 214 lb 9.6 oz (97.342 kg)  BMI 29.10 kg/m2  SpO2 97%  Body mass index is 29.1 kg/(m^2).       Physical exam well-developed well-nourished appearing stated  age in no acute distress Carotid arteries without bruits bilaterally Well-healed right neck and left neck incision 2+ radial pulses bilaterally Grossly intact neurologically Heart regular rate and rhythm without murmur  Carotid duplex today was reviewed with the patient. This does show widely patent endarterectomy bilaterally with no evidence of recurrent stenosis. He has a normal antegrade flow in both vertebral arteries and has slight diminished blood pressure on the right versus left brachial artery with no evidence of stenosis by duplex in the subclavian stent.  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 We'll continue usual activity. Is on aspirin and Plavix. We'll see [place] again in one year with repeat duplex

## 2014-03-14 ENCOUNTER — Other Ambulatory Visit: Payer: Self-pay | Admitting: *Deleted

## 2014-03-14 ENCOUNTER — Telehealth: Payer: Self-pay | Admitting: *Deleted

## 2014-03-14 MED ORDER — NITROGLYCERIN 0.4 MG SL SUBL: 0.4000 mg | SUBLINGUAL_TABLET | SUBLINGUAL | Status: DC | PRN

## 2014-03-14 MED ORDER — NITROGLYCERIN 0.4 MG SL SUBL: 0.4000 mg | SUBLINGUAL_TABLET | SUBLINGUAL | Status: AC | PRN

## 2014-03-14 NOTE — Telephone Encounter (Signed)
Nitroglycerin is on pts med list at [facility]. Ok to refill.

## 2014-03-14 NOTE — Telephone Encounter (Signed)
Patient requests nitro, but this is not on his list. Can you check the [facility] chart and see if ok to refill? Please advise. Thanks, MI

## 2014-04-15 ENCOUNTER — Encounter: Payer: Self-pay | Admitting: *Deleted

## 2014-05-13 ENCOUNTER — Other Ambulatory Visit: Payer: Self-pay

## 2014-06-30 ENCOUNTER — Other Ambulatory Visit: Payer: Self-pay | Admitting: Cardiology

## 2014-07-07 ENCOUNTER — Encounter (HOSPITAL_COMMUNITY): Payer: Self-pay | Admitting: Cardiology

## 2014-07-21 ENCOUNTER — Other Ambulatory Visit: Payer: Self-pay | Admitting: Cardiology

## 2014-08-18 ENCOUNTER — Other Ambulatory Visit: Payer: Self-pay | Admitting: Gastroenterology

## 2014-09-28 ENCOUNTER — Other Ambulatory Visit: Payer: Self-pay | Admitting: Cardiology

## 2014-10-24 ENCOUNTER — Encounter (HOSPITAL_COMMUNITY): Payer: Self-pay | Admitting: *Deleted

## 2014-10-25 ENCOUNTER — Encounter (HOSPITAL_COMMUNITY): Payer: Self-pay | Admitting: *Deleted

## 2014-11-08 ENCOUNTER — Encounter (HOSPITAL_COMMUNITY)
Admission: RE | Disposition: A | Discharge status: Home / Self Care | Payer: Self-pay | Source: Ambulatory Visit | Attending: Gastroenterology

## 2014-11-08 ENCOUNTER — Ambulatory Visit (HOSPITAL_COMMUNITY)
Admission: RE | Admit: 2014-11-08 | Discharge: 2014-11-08 | Disposition: A | Discharge status: Home / Self Care | Payer: 59 | Source: Ambulatory Visit | Attending: Gastroenterology | Admitting: Gastroenterology

## 2014-11-08 ENCOUNTER — Ambulatory Visit (HOSPITAL_COMMUNITY): Payer: 59 | Admitting: Anesthesiology

## 2014-11-08 DIAGNOSIS — Z87891 Personal history of nicotine dependence: Secondary | ICD-10-CM | POA: Insufficient documentation

## 2014-11-08 DIAGNOSIS — I10 Essential (primary) hypertension: Secondary | ICD-10-CM | POA: Insufficient documentation

## 2014-11-08 DIAGNOSIS — Z7982 Long term (current) use of aspirin: Secondary | ICD-10-CM | POA: Insufficient documentation

## 2014-11-08 DIAGNOSIS — E119 Type 2 diabetes mellitus without complications: Secondary | ICD-10-CM | POA: Diagnosis not present

## 2014-11-08 DIAGNOSIS — M1990 Unspecified osteoarthritis, unspecified site: Secondary | ICD-10-CM | POA: Insufficient documentation

## 2014-11-08 DIAGNOSIS — Z79899 Other long term (current) drug therapy: Secondary | ICD-10-CM | POA: Insufficient documentation

## 2014-11-08 DIAGNOSIS — Z951 Presence of aortocoronary bypass graft: Secondary | ICD-10-CM | POA: Insufficient documentation

## 2014-11-08 DIAGNOSIS — I739 Peripheral vascular disease, unspecified: Secondary | ICD-10-CM | POA: Diagnosis not present

## 2014-11-08 DIAGNOSIS — Z86010 Personal history of colonic polyps: Secondary | ICD-10-CM | POA: Diagnosis not present

## 2014-11-08 DIAGNOSIS — I2510 Atherosclerotic heart disease of native coronary artery without angina pectoris: Secondary | ICD-10-CM | POA: Diagnosis not present

## 2014-11-08 DIAGNOSIS — Z8673 Personal history of transient ischemic attack (TIA), and cerebral infarction without residual deficits: Secondary | ICD-10-CM | POA: Insufficient documentation

## 2014-11-08 DIAGNOSIS — Z09 Encounter for follow-up examination after completed treatment for conditions other than malignant neoplasm: Secondary | ICD-10-CM | POA: Insufficient documentation

## 2014-11-08 HISTORY — PX: COLONOSCOPY WITH PROPOFOL: SHX5780

## 2014-11-08 SURGERY — COLONOSCOPY WITH PROPOFOL
Anesthesia: Monitor Anesthesia Care

## 2014-11-08 MED ORDER — PROPOFOL 10 MG/ML IV BOLUS
INTRAVENOUS | Status: DC | PRN
  Administered 2014-11-08: 20 mg via INTRAVENOUS
  Administered 2014-11-08: 10 mg via INTRAVENOUS
  Administered 2014-11-08: 50 mg via INTRAVENOUS
  Administered 2014-11-08: 30 mg via INTRAVENOUS
  Administered 2014-11-08: 50 mg via INTRAVENOUS
  Administered 2014-11-08: 20 mg via INTRAVENOUS

## 2014-11-08 MED ORDER — LACTATED RINGERS IV SOLN
INTRAVENOUS | Status: DC | PRN
  Administered 2014-11-08: 10:00:00 via INTRAVENOUS

## 2014-11-08 MED ORDER — PROPOFOL 10 MG/ML IV BOLUS
INTRAVENOUS | Status: AC
  Filled 2014-11-08: qty 20

## 2014-11-08 SURGICAL SUPPLY — 22 items

## 2014-11-08 NOTE — H&P (Signed)
  Procedure: Surveillance colonoscopy. 01/17/2009 colonoscopy performed with removal of a small tubular adenomatous sigmoid colon polyp  History: The patient is a 64 year old male born 1951-06-23. He is scheduled to undergo a surveillance colonoscopy today. He chronically takes aspirin and Plavix. He stopped taking Plavix approximately 7 days ago.  Past medical history: Hypertension. Hypercholesterolemia. Stroke with right-sided weakness diagnosed in 2006. Bilateral carotid endarterectomies performed in 2006. Allergic rhinitis. Osteoarthritis. Type 2 diabetes mellitus. Right subclavian artery stenosis with stent placement in 2012. Coronary artery disease with three-vessel coronary artery bypass grafting surgery. Rheumatoid arthritis treated with Remicade. Left knee surgery. Left ankle surgery.  Allergies: Codeine and statin drugs.  Exam: The patient is alert and lying comfortably on the endoscopy stretcher. Abdomen is soft and nontender to palpation. Lungs are clear to auscultation. Cardiac exam reveals a regular rhythm.  Plan: Proceed with surveillance colonoscopy

## 2014-11-08 NOTE — Anesthesia Preprocedure Evaluation (Signed)
Anesthesia Evaluation  Patient identified by MRN, date of birth, ID band Patient awake    Reviewed: Allergy & Precautions, NPO status , Patient's Chart, lab work & pertinent test results  Airway Mallampati: II  TM Distance: >3 FB Neck ROM: Full    Dental no notable dental hx.    Pulmonary neg pulmonary ROS, former smoker,  breath sounds clear to auscultation  Pulmonary exam normal       Cardiovascular hypertension, Pt. on home beta blockers and Pt. on medications + CAD, + CABG and + Peripheral Vascular Disease Rhythm:Regular Rate:Normal     Neuro/Psych CVA negative psych ROS   GI/Hepatic negative GI ROS, Neg liver ROS,   Endo/Other  diabetes  Renal/GU negative Renal ROS  negative genitourinary   Musculoskeletal  (+) Arthritis -, Rheumatoid disorders,    Abdominal   Peds negative pediatric ROS (+)  Hematology negative hematology ROS (+)   Anesthesia Other Findings   Reproductive/Obstetrics negative OB ROS                             Anesthesia Physical Anesthesia Plan  ASA: III  Anesthesia Plan: MAC   Post-op Pain Management:    Induction: Intravenous  Airway Management Planned: Simple Face Mask  Additional Equipment:   Intra-op Plan:   Post-operative Plan:   Informed Consent: I have reviewed the patients History and Physical, chart, labs and discussed the procedure including the risks, benefits and alternatives for the proposed anesthesia with the patient or authorized representative who has indicated his/her understanding and acceptance.   Dental advisory given  Plan Discussed with: CRNA and Surgeon  Anesthesia Plan Comments:         Anesthesia Quick Evaluation

## 2014-11-08 NOTE — Transfer of Care (Signed)
Immediate Anesthesia Transfer of Care Note  Patient: Colin Parks  Procedure(s) Performed: Procedure(s): COLONOSCOPY WITH PROPOFOL (N/A)  Patient Location: PACU  Anesthesia Type:MAC  Level of Consciousness: sedated  Airway & Oxygen Therapy: Patient Spontanous Breathing and Patient connected to nasal cannula oxygen  Post-op Assessment: Report given to RN and Post -op Vital signs reviewed and stable  Post vital signs: Reviewed and stable  Last Vitals:  Filed Vitals:   11/08/14 1015  Temp: 37.1 C  Resp: 16    Complications: No apparent anesthesia complications

## 2014-11-08 NOTE — Discharge Instructions (Signed)

## 2014-11-08 NOTE — Anesthesia Postprocedure Evaluation (Signed)
  Anesthesia Post-op Note  Patient: Colin Parks  Procedure(s) Performed: Procedure(s) (LRB): COLONOSCOPY WITH PROPOFOL (N/A)  Patient Location: PACU  Anesthesia Type: MAC  Level of Consciousness: awake and alert   Airway and Oxygen Therapy: Patient Spontanous Breathing  Post-op Pain: mild  Post-op Assessment: Post-op Vital signs reviewed, Patient's Cardiovascular Status Stable, Respiratory Function Stable, Patent Airway and No signs of Nausea or vomiting  Last Vitals:  Filed Vitals:   11/08/14 1136  Pulse: 83  Temp: 37.2 C  Resp: 16    Post-op Vital Signs: stable   Complications: No apparent anesthesia complications

## 2014-11-08 NOTE — Op Note (Signed)
Procedure: Surveillance colonoscopy. 01/17/2009 colonoscopy performed with removal of a small tubular adenomatous sigmoid colon polyp  Endoscopist: Earle Gell  Premedication: Propofol administered by anesthesia  Procedure: The patient was placed in the left lateral decubitus position. Anal inspection and digital rectal exam were normal. The Pentax pediatric colonoscope was introduced into the rectum and advanced to the cecum. A normal-appearing appendiceal orifice and ileocecal valve were identified. Colonic preparation for the exam today was good. Withdrawal time was 9 minutes  Rectum. Normal. Retroflexed view of the distal rectum normal  Sigmoid colon and descending colon. Normal  Splenic flexure. Normal  Transverse colon. Normal  Hepatic flexure. Normal  Ascending colon. Normal  Cecum and ileocecal valve. Normal  Assessment: Normal surveillance colonoscopy  Recommendation: Schedule repeat surveillance colonoscopy in 10 years

## 2014-11-09 ENCOUNTER — Encounter (HOSPITAL_COMMUNITY): Payer: Self-pay | Admitting: Gastroenterology

## 2014-12-09 ENCOUNTER — Other Ambulatory Visit: Payer: Self-pay | Admitting: Cardiology

## 2015-01-02 ENCOUNTER — Other Ambulatory Visit: Payer: Self-pay | Admitting: Cardiology

## 2015-01-23 ENCOUNTER — Other Ambulatory Visit: Payer: Self-pay

## 2015-03-10 ENCOUNTER — Other Ambulatory Visit: Payer: Self-pay | Admitting: Cardiology

## 2015-03-13 ENCOUNTER — Encounter: Payer: Self-pay | Admitting: Family

## 2015-03-14 ENCOUNTER — Ambulatory Visit (HOSPITAL_COMMUNITY)
Admission: RE | Admit: 2015-03-14 | Discharge: 2015-03-14 | Disposition: A | Discharge status: Home / Self Care | Payer: 59 | Source: Ambulatory Visit | Attending: Family | Admitting: Family

## 2015-03-14 ENCOUNTER — Encounter: Payer: Self-pay | Admitting: Family

## 2015-03-14 ENCOUNTER — Ambulatory Visit (INDEPENDENT_AMBULATORY_CARE_PROVIDER_SITE_OTHER): Payer: 59 | Admitting: Family

## 2015-03-14 VITALS — BP 134/77 | HR 79 | Temp 97.3°F | Resp 16 | Ht 72.0 in | Wt 216.0 lb

## 2015-03-14 DIAGNOSIS — Z48812 Encounter for surgical aftercare following surgery on the circulatory system: Secondary | ICD-10-CM

## 2015-03-14 DIAGNOSIS — Z87891 Personal history of nicotine dependence: Secondary | ICD-10-CM | POA: Diagnosis not present

## 2015-03-14 DIAGNOSIS — I6523 Occlusion and stenosis of bilateral carotid arteries: Secondary | ICD-10-CM | POA: Diagnosis not present

## 2015-03-14 DIAGNOSIS — Z9889 Other specified postprocedural states: Secondary | ICD-10-CM

## 2015-03-14 NOTE — Progress Notes (Signed)
Established Carotid Patient   History of Present Illness  Colin Parks is a 64 y.o. male patient of Dr. Donnetta Hutching presents today for followup of extracranial cerebrovascular disease. He underwent left carotid endarterectomy 2006, right carotid endarterectomy 2008 and right subclavian artery stenting in 2011. He remains asymptomatic. He did have a preoperative TIA before the left CEA as manifested by slurred speech and dizziness with standing. He denies any subsequent TIA's or stroke. This was non-debilitating to him.  Pt denies tingling, numbness, pain, or cold sensation in either upper extremity.  He also has extensive coronary disease reports that he is stable from this as well. He had a 5 vessel CABG in 2013, denies any history of MI. He has rheumatoid arthritis. He denies claudication symptoms with walking.   The patient reports New Medical or Surgical History: recent normal colonoscopy.  Pt Diabetic: yes, states his last A1C was 7.2 Pt smoker: former smoker, quit in 1975  Pt meds include: Statin : yes ASA: yes Other anticoagulants/antiplatelets: Plavix   Past Medical History  Diagnosis Date  . Hypertension   . Diabetes mellitus   . Hyperlipidemia   . Arthritis   . Coronary artery disease   . Stroke 2006  . PVD (peripheral vascular disease)   . Type II or unspecified type diabetes mellitus with peripheral circulatory disorders, not stated as uncontrolled(250.70) 05/20/2013  . RA (rheumatoid arthritis) 05/20/2013  . Carotid artery occlusion   . Atrial fibrillation     Social History Social History  Substance Use Topics  . Smoking status: Former Smoker    Quit date: 08/07/1971  . Smokeless tobacco: Former Systems developer  . Alcohol Use: Yes     Comment: one beer once a month    Family History Family History  Problem Relation Age of Onset  . Heart disease Mother   . Hypertension Mother   . Hyperlipidemia Mother   . Heart disease Father   . Vision loss Sister   . Peripheral  vascular disease Brother     Surgical History Past Surgical History  Procedure Laterality Date  . Angioplasty  05/10/2005    Dacron patch  . Cardiac catheterization  02/06/2010    Aortic Arch - Second order catheterization (right subclavian artery)  . Carotid stent  02/06/2010    right subclavian artery  . Hand surgery  right hand cyst removed from knuckle per pt.    2008  . Knee surgery  left knee 2004  . Ankle surgery  left ankle 2002  . Carotid endarterectomy  05/10/2005    Left  . Carotid endarterectomy Right 2009    Dr. Donnetta Hutching  . Coronary artery bypass graft  05/22/2012    Procedure: CORONARY ARTERY BYPASS GRAFTING (CABG);  Surgeon: Melrose Nakayama, MD;  Location: [facility];  Service: Open Heart Surgery;  Laterality: N/A;  times five, using Left Internal Mammary Artery and Right Greater Saphenous Vein Graft harvested Endoscopically  . Endovein harvest of greater saphenous vein  05/22/2012    Procedure: ENDOVEIN HARVEST OF GREATER SAPHENOUS VEIN;  Surgeon: Melrose Nakayama, MD;  Location: [facility];  Service: Open Heart Surgery;  Laterality: Right;  . Left heart catheterization with coronary angiogram N/A 05/14/2012    Procedure: LEFT HEART CATHETERIZATION WITH CORONARY ANGIOGRAM;  Surgeon: Candee Furbish, MD;  Location: [facility] CATH LAB;  Service: Cardiovascular;  Laterality: N/A;  . Colonoscopy with propofol N/A 11/08/2014    Procedure: COLONOSCOPY WITH PROPOFOL;  Surgeon: Garlan Fair, MD;  Location: [facility] ENDOSCOPY;  Service: Endoscopy;  Laterality: N/A;    No Known Allergies  Current Outpatient Prescriptions  Medication Sig Dispense Refill  . acetaminophen (TYLENOL) 500 MG tablet Take 500 mg by mouth every 6 (six) hours as needed for moderate pain or headache.     Marland Kitchen aspirin 81 MG EC tablet Take 1 tablet (81 mg total) by mouth daily. Swallow whole. 30 tablet 12  . clopidogrel (PLAVIX) 75 MG tablet Take 1 tablet (75 mg total) by mouth daily.    Marland Kitchen desoximetasone (TOPICORT) 0.25 %  cream Apply 0.25 % topically as needed (rash on legs.).     Marland Kitchen ezetimibe-simvastatin (VYTORIN) 10-20 MG per tablet Take 1 tablet by mouth at bedtime.      . fexofenadine (ALLEGRA) 180 MG tablet Take 180 mg by mouth every morning.     Marland Kitchen FORMIC ACID EX Take 1 mg by mouth at bedtime.     . Glucosamine-Chondroit-Vit C-Mn (GLUCOSAMINE CHONDR 1500 COMPLX PO) Take 1 capsule by mouth every morning.     . lansoprazole (PREVACID) 15 MG capsule Take 15 mg by mouth every evening.     . metFORMIN (GLUMETZA) 500 MG (MOD) 24 hr tablet Take 500 mg by mouth 3 (three) times daily with meals.     . METHOTREXATE, ANTI-RHEUMATIC, PO Inject 1 each into the muscle once a week. Friday-- "8 on the needle"    . metoprolol tartrate (LOPRESSOR) 25 MG tablet TAKE 1 TABLET TWICE A DAY 180 tablet 0  . Multiple Vitamins-Minerals (GNP MEGA MULTI FOR MEN PO) Take 1 tablet by mouth every morning.     . nitroGLYCERIN (NITROSTAT) 0.4 MG SL tablet Place 1 tablet (0.4 mg total) under the tongue every 5 (five) minutes as needed for chest pain. 25 tablet 3  . ramipril (ALTACE) 2.5 MG capsule TAKE 1 CAPSULE DAILY (NEED TO CONTACT OFFICE TO SCHEDULE APPOINTMENT FOR FURTHER REFILLS [phone]) 30 capsule 0  . REMICADE 100 MG injection Inject 100 mg into the vein every 8 (eight) weeks.     . sitaGLIPtin (JANUVIA) 100 MG tablet Take 100 mg by mouth every evening.     . sodium chloride (OCEAN) 0.65 % SOLN nasal spray Place 1 spray into both nostrils as needed for congestion.     No current facility-administered medications for this visit.    Review of Systems : See HPI for pertinent positives and negatives.  Physical Examination  Filed Vitals:   03/14/15 1400 03/14/15 1402  BP: 111/78 134/77  Pulse: 82 79  Temp:  97.3 F (36.3 C)  TempSrc:  Oral  Resp:  16  Height:  6' (1.829 m)  Weight:  216 lb (97.977 kg)  SpO2:  99%   Body mass index is 29.29 kg/(m^2).  General: WDWN male in NAD GAIT: normal Eyes: PERRLA Pulmonary:   Non-labored, CTAB, no rales, no rhonchi, & no wheezing.  Cardiac: regular rhythm,  no detected murmur.  VASCULAR EXAM Carotid Bruits Right Left   Negative Positive    Aorta is not palpable. Radial pulses are 1+ palpable on the right and 2+ palpable on the left.  LE Pulses Right Left       POPLITEAL  not palpable   not palpable       DORSALIS PEDIS      ANTERIOR TIBIAL faintly palpable  faintly palpable     Gastrointestinal: soft, nontender, BS WNL, no r/g,  no palpable masses.  Musculoskeletal: No muscle atrophy/wasting. M/S 5/5 throughout, extremities without ischemic changes.  Neurologic: A&O X 3; Appropriate Affect, Speech is normal CN 2-12 intact, pain and light touch intact in extremities, Motor exam as listed above.   Non-Invasive Vascular Imaging CAROTID DUPLEX 03/14/2015   CEREBROVASCULAR DUPLEX EVALUATION    INDICATION: Carotid endarterectomy     PREVIOUS INTERVENTION(S): Left carotid endarterectomy on 05/10/05; Right carotid endarterectomy on 06/18/07; Right subclavian artery stent on 02/06/10    DUPLEX EXAM:     RIGHT  LEFT  Peak Systolic Velocities (cm/s) End Diastolic Velocities (cm/s) Plaque LOCATION Peak Systolic Velocities (cm/s) End Diastolic Velocities (cm/s) Plaque  103 20  CCA PROXIMAL 116 21   96 21 HT CCA MID 124 23 HT  110 26  CCA DISTAL 126 24 HT  219 0 HT ECA 123 0 HT  52 17 HT ICA PROXIMAL 107 13 HT  57 19  ICA MID 81 22   62 22  ICA DISTAL 60 19     Not Calculated ICA / CCA Ratio (PSV) Not Calculated  Antegrade Vertebral Flow Antegrade  295 Brachial Systolic Pressure (mmHg) 284  Biphasic (subclavian artery) Brachial Artery Waveforms Triphasic (subclavian artery)    Plaque Morphology:  HM = Homogeneous, HT = Heterogeneous, CP = Calcific Plaque, SP = Smooth Plaque, IP = Irregular Plaque     ADDITIONAL FINDINGS: . No  significant stenosis of the left external or bilateral common carotid arteries. . Right external carotid artery stenosis noted. . Maximum velocity of 298cm/s noted in the right proximal subclavian artery, based on limited visualization due to anatomic limitations. . Greater than 55mm/Mg difference in the bilateral brachial pressures noted.    IMPRESSION: 1. Patent bilateral carotid endarterectomy sites with Doppler velocities suggestive of 1-39% bilateral proximal internal carotid artery stenoses. 2. Evidence of a patent right subclavian artery stent with a maximum velocity noted above    Compared to the previous exam:  No significant change noted when compared to the previous exam on 03/08/14.      Assessment: Colin Parks is a 64 y.o. male who is s/p left carotid endarterectomy in 2006, right carotid endarterectomy in 2008 and right subclavian artery stenting in 2011. He remains asymptomatic. He did have a preoperative TIA before the left CEA as manifested by slurred speech and dizziness with standing. He denies any subsequent TIA's or stroke. This was non-debilitating to him.  Pt denies tingling, numbness, pain, or cold sensation in either upper extremity.   Today's carotid artery Duplex suggests patent bilateral carotid endarterectomy sites with Doppler velocities suggestive of 1-39% bilateral proximal internal carotid artery stenoses. Evidence of a patent right subclavian artery stent with a maximum velocity noted above. No significant change noted when compared to the previous exam on 03/08/14.  His atherosclerotic risk factors include DM that is almost in control. He is taking ASA, a statin, and Plavix. He has started exercising 3 days/week in a gym.  Plan: Follow-up in 1 year with Carotid Duplex.   I discussed in depth with the patient the nature of atherosclerosis, and emphasized the importance of maximal medical management including strict control of blood pressure, blood  glucose, and lipid  levels, obtaining regular exercise, and continued cessation of smoking.  The patient is aware that without maximal medical management the underlying atherosclerotic disease process will progress, limiting the benefit of any interventions. The patient was given information about stroke prevention and what symptoms should prompt the patient to seek immediate medical care. Thank you for allowing [place] to participate in this patient's care.  Clemon Chambers, RN, MSN, FNP-C [facility] Office: [phone]  Clinic Physician: Early  03/14/2015 2:12 PM

## 2015-03-14 NOTE — Patient Instructions (Signed)
Stroke Prevention Some medical conditions and behaviors are associated with an increased chance of having a stroke. You may prevent a stroke by making healthy choices and managing medical conditions. HOW CAN I REDUCE MY RISK OF HAVING A STROKE?   Stay physically active. Get at least 30 minutes of activity on most or all days.  Do not smoke. It may also be helpful to avoid exposure to secondhand smoke.  Limit alcohol use. Moderate alcohol use is considered to be:  No more than 2 drinks per day for men.  No more than 1 drink per day for nonpregnant women.  Eat healthy foods. This involves:  Eating 5 or more servings of fruits and vegetables a day.  Making dietary changes that address high blood pressure (hypertension), high cholesterol, diabetes, or obesity.  Manage your cholesterol levels.  Making food choices that are high in fiber and low in saturated fat, trans fat, and cholesterol may control cholesterol levels.  Take any prescribed medicines to control cholesterol as directed by your health care provider.  Manage your diabetes.  Controlling your carbohydrate and sugar intake is recommended to manage diabetes.  Take any prescribed medicines to control diabetes as directed by your health care provider.  Control your hypertension.  Making food choices that are low in salt (sodium), saturated fat, trans fat, and cholesterol is recommended to manage hypertension.  Take any prescribed medicines to control hypertension as directed by your health care provider.  Maintain a healthy weight.  Reducing calorie intake and making food choices that are low in sodium, saturated fat, trans fat, and cholesterol are recommended to manage weight.  Stop drug abuse.  Avoid taking birth control pills.  Talk to your health care provider about the risks of taking birth control pills if you are over 35 years old, smoke, get migraines, or have ever had a blood clot.  Get evaluated for sleep  disorders (sleep apnea).  Talk to your health care provider about getting a sleep evaluation if you snore a lot or have excessive sleepiness.  Take medicines only as directed by your health care provider.  For some people, aspirin or blood thinners (anticoagulants) are helpful in reducing the risk of forming abnormal blood clots that can lead to stroke. If you have the irregular heart rhythm of atrial fibrillation, you should be on a blood thinner unless there is a good reason you cannot take them.  Understand all your medicine instructions.  Make sure that other conditions (such as anemia or atherosclerosis) are addressed. SEEK IMMEDIATE MEDICAL CARE IF:   You have sudden weakness or numbness of the face, arm, or leg, especially on one side of the body.  Your face or eyelid droops to one side.  You have sudden confusion.  You have trouble speaking (aphasia) or understanding.  You have sudden trouble seeing in one or both eyes.  You have sudden trouble walking.  You have dizziness.  You have a loss of balance or coordination.  You have a sudden, severe headache with no known cause.  You have new chest pain or an irregular heartbeat. Any of these symptoms may represent a serious problem that is an emergency. Do not wait to see if the symptoms will go away. Get medical help at once. Call your local emergency services (911 in [place] Do not drive yourself to the hospital. Document Released: 08/22/2004 Document Revised: 11/29/2013 Document Reviewed: 01/15/2013 [employer] Patient Information 2015 [employer]. This information is not intended to replace advice given   to you by your health care provider. Make sure you discuss any questions you have with your health care provider.  

## 2015-03-15 NOTE — Addendum Note (Signed)
Addended by: Dorthula Rue L on: 03/15/2015 05:16 PM   Modules accepted: Orders

## 2015-04-01 ENCOUNTER — Other Ambulatory Visit: Payer: Self-pay | Admitting: Cardiology

## 2015-06-05 ENCOUNTER — Other Ambulatory Visit: Payer: Self-pay | Admitting: Cardiology

## 2015-07-09 ENCOUNTER — Other Ambulatory Visit: Payer: Self-pay | Admitting: Cardiology

## 2016-02-01 DIAGNOSIS — M0579 Rheumatoid arthritis with rheumatoid factor of multiple sites without organ or systems involvement: Secondary | ICD-10-CM | POA: Diagnosis not present

## 2016-02-01 DIAGNOSIS — Z79899 Other long term (current) drug therapy: Secondary | ICD-10-CM | POA: Diagnosis not present

## 2016-02-29 ENCOUNTER — Encounter: Payer: Self-pay | Admitting: Family

## 2016-03-01 ENCOUNTER — Ambulatory Visit (INDEPENDENT_AMBULATORY_CARE_PROVIDER_SITE_OTHER): Payer: Medicare Other | Admitting: Family

## 2016-03-01 ENCOUNTER — Ambulatory Visit (HOSPITAL_COMMUNITY)
Admission: RE | Admit: 2016-03-01 | Discharge: 2016-03-01 | Disposition: A | Discharge status: Home / Self Care | Payer: Medicare Other | Source: Ambulatory Visit | Attending: Family | Admitting: Family

## 2016-03-01 ENCOUNTER — Encounter: Payer: Self-pay | Admitting: Family

## 2016-03-01 VITALS — BP 133/77 | HR 84 | Temp 97.4°F | Resp 18 | Ht 72.25 in | Wt 212.0 lb

## 2016-03-01 DIAGNOSIS — Z48812 Encounter for surgical aftercare following surgery on the circulatory system: Secondary | ICD-10-CM

## 2016-03-01 DIAGNOSIS — Z959 Presence of cardiac and vascular implant and graft, unspecified: Secondary | ICD-10-CM

## 2016-03-01 DIAGNOSIS — Z87891 Personal history of nicotine dependence: Secondary | ICD-10-CM

## 2016-03-01 DIAGNOSIS — Z9889 Other specified postprocedural states: Secondary | ICD-10-CM

## 2016-03-01 DIAGNOSIS — I2510 Atherosclerotic heart disease of native coronary artery without angina pectoris: Secondary | ICD-10-CM | POA: Diagnosis not present

## 2016-03-01 DIAGNOSIS — I771 Stricture of artery: Secondary | ICD-10-CM

## 2016-03-01 DIAGNOSIS — E785 Hyperlipidemia, unspecified: Secondary | ICD-10-CM | POA: Diagnosis not present

## 2016-03-01 DIAGNOSIS — I6523 Occlusion and stenosis of bilateral carotid arteries: Secondary | ICD-10-CM

## 2016-03-01 DIAGNOSIS — I10 Essential (primary) hypertension: Secondary | ICD-10-CM | POA: Diagnosis not present

## 2016-03-01 DIAGNOSIS — E1169 Type 2 diabetes mellitus with other specified complication: Secondary | ICD-10-CM | POA: Insufficient documentation

## 2016-03-01 NOTE — Patient Instructions (Signed)
Stroke Prevention Some medical conditions and behaviors are associated with an increased chance of having a stroke. You may prevent a stroke by making healthy choices and managing medical conditions. HOW CAN I REDUCE MY RISK OF HAVING A STROKE?   Stay physically active. Get at least 30 minutes of activity on most or all days.  Do not smoke. It may also be helpful to avoid exposure to secondhand smoke.  Limit alcohol use. Moderate alcohol use is considered to be:  No more than 2 drinks per day for men.  No more than 1 drink per day for nonpregnant women.  Eat healthy foods. This involves:  Eating 5 or more servings of fruits and vegetables a day.  Making dietary changes that address high blood pressure (hypertension), high cholesterol, diabetes, or obesity.  Manage your cholesterol levels.  Making food choices that are high in fiber and low in saturated fat, trans fat, and cholesterol may control cholesterol levels.  Take any prescribed medicines to control cholesterol as directed by your health care provider.  Manage your diabetes.  Controlling your carbohydrate and sugar intake is recommended to manage diabetes.  Take any prescribed medicines to control diabetes as directed by your health care provider.  Control your hypertension.  Making food choices that are low in salt (sodium), saturated fat, trans fat, and cholesterol is recommended to manage hypertension.  Ask your health care provider if you need treatment to lower your blood pressure. Take any prescribed medicines to control hypertension as directed by your health care provider.  If you are 18-39 years of age, have your blood pressure checked every 3-5 years. If you are 40 years of age or older, have your blood pressure checked every year.  Maintain a healthy weight.  Reducing calorie intake and making food choices that are low in sodium, saturated fat, trans fat, and cholesterol are recommended to manage  weight.  Stop drug abuse.  Avoid taking birth control pills.  Talk to your health care provider about the risks of taking birth control pills if you are over 35 years old, smoke, get migraines, or have ever had a blood clot.  Get evaluated for sleep disorders (sleep apnea).  Talk to your health care provider about getting a sleep evaluation if you snore a lot or have excessive sleepiness.  Take medicines only as directed by your health care provider.  For some people, aspirin or blood thinners (anticoagulants) are helpful in reducing the risk of forming abnormal blood clots that can lead to stroke. If you have the irregular heart rhythm of atrial fibrillation, you should be on a blood thinner unless there is a good reason you cannot take them.  Understand all your medicine instructions.  Make sure that other conditions (such as anemia or atherosclerosis) are addressed. SEEK IMMEDIATE MEDICAL CARE IF:   You have sudden weakness or numbness of the face, arm, or leg, especially on one side of the body.  Your face or eyelid droops to one side.  You have sudden confusion.  You have trouble speaking (aphasia) or understanding.  You have sudden trouble seeing in one or both eyes.  You have sudden trouble walking.  You have dizziness.  You have a loss of balance or coordination.  You have a sudden, severe headache with no known cause.  You have new chest pain or an irregular heartbeat. Any of these symptoms may represent a serious problem that is an emergency. Do not wait to see if the symptoms will   go away. Get medical help at once. Call your local emergency services (911 in [place] Do not drive yourself to the hospital.   This information is not intended to replace advice given to you by your health care provider. Make sure you discuss any questions you have with your health care provider.   Document Released: 08/22/2004 Document Revised: 08/05/2014 Document Reviewed:  01/15/2013 Elsevier Interactive Patient Education 2016 [employer]  

## 2016-03-01 NOTE — Progress Notes (Signed)
Chief Complaint: Follow up Extracranial Carotid Artery Stenosis   History of Present Illness  Colin Parks is a 65 y.o. male patient of Dr. Donnetta Hutching presents today for followup of extracranial cerebrovascular disease. He underwent left carotid endarterectomy in 2006, right carotid endarterectomy in 2008, and right subclavian artery stenting in 2011. He remains asymptomatic. He did have a preoperative TIA before the left CEA as manifested by slurred speech and dizziness with standing. He denies any subsequent TIA's or stroke. This was non-debilitating to him.  Pt denies tingling, numbness, pain, or cold sensation in either upper extremity.   He also has extensive coronary disease reports that he is stable from this as well. He had a 5 vessel CABG in 2013, denies any history of MI. He has rheumatoid arthritis and receives treatment for that. He denies claudication symptoms with walking.  Pt Diabetic: yes, states his last A1C was 7.? Pt smoker: former smoker, quit in 1975  Pt meds include: Statin : yes ASA: yes Other anticoagulants/antiplatelets: Plavix   Past Medical History:  Diagnosis Date  . Arthritis   . Atrial fibrillation ([place])   . Carotid artery occlusion   . Coronary artery disease   . Diabetes mellitus   . Hyperlipidemia   . Hypertension   . PVD (peripheral vascular disease) ([place])   . RA (rheumatoid arthritis) ([place]) 05/20/2013  . Stroke [facility]) 2006  . Type II or unspecified type diabetes mellitus with peripheral circulatory disorders, not stated as uncontrolled(250.70) 05/20/2013    Social History Social History  Substance Use Topics  . Smoking status: Former Smoker    Quit date: 08/07/1971  . Smokeless tobacco: Former Systems developer  . Alcohol use Yes     Comment: one beer once a month    Family History Family History  Problem Relation Age of Onset  . Heart disease Mother   . Hypertension Mother   . Hyperlipidemia Mother   . Heart disease Father   . Vision loss  Sister   . Peripheral vascular disease Brother     Surgical History Past Surgical History:  Procedure Laterality Date  . ANGIOPLASTY  05/10/2005   Dacron patch  . ANKLE SURGERY  left ankle 2002  . CARDIAC CATHETERIZATION  02/06/2010   Aortic Arch - Second order catheterization (right subclavian artery)  . CAROTID ENDARTERECTOMY  05/10/2005   Left  . CAROTID ENDARTERECTOMY Right 2009   Dr. Donnetta Hutching  . CAROTID STENT  02/06/2010   right subclavian artery  . COLONOSCOPY WITH PROPOFOL N/A 11/08/2014   Procedure: COLONOSCOPY WITH PROPOFOL;  Surgeon: Garlan Fair, MD;  Location: [facility];  Service: Endoscopy;  Laterality: N/A;  . CORONARY ARTERY BYPASS GRAFT  05/22/2012   Procedure: CORONARY ARTERY BYPASS GRAFTING (CABG);  Surgeon: Melrose Nakayama, MD;  Location: [place];  Service: Open Heart Surgery;  Laterality: N/A;  times five, using Left Internal Mammary Artery and Right Greater Saphenous Vein Graft harvested Endoscopically  . ENDOVEIN HARVEST OF GREATER SAPHENOUS VEIN  05/22/2012   Procedure: ENDOVEIN HARVEST OF GREATER SAPHENOUS VEIN;  Surgeon: Melrose Nakayama, MD;  Location: [place];  Service: Open Heart Surgery;  Laterality: Right;  . HAND SURGERY  right hand cyst removed from knuckle per pt.    2008  . KNEE SURGERY  left knee 2004  . LEFT HEART CATHETERIZATION WITH CORONARY ANGIOGRAM N/A 05/14/2012   Procedure: LEFT HEART CATHETERIZATION WITH CORONARY ANGIOGRAM;  Surgeon: Candee Furbish, MD;  Location[facility] C[facility] LAB;  Service: Cardiovascular;  Laterality: N/A;    No Known Allergies  Current Outpatient Prescriptions  Medication Sig Dispense Refill  . acetaminophen (TYLENOL) 500 MG tablet Take 500 mg by mouth every 6 (six) hours as needed for moderate pain or headache.     Marland Kitchen aspirin 81 MG EC tablet Take 1 tablet (81 mg total) by mouth daily. Swallow whole. 30 tablet 12  . clopidogrel (PLAVIX) 75 MG tablet Take 1 tablet (75 mg total) by mouth daily.    Marland Kitchen desoximetasone  (TOPICORT) 0.25 % cream Apply 0.25 % topically as needed (rash on legs.).     Marland Kitchen ezetimibe-simvastatin (VYTORIN) 10-20 MG per tablet Take 1 tablet by mouth at bedtime.      . fexofenadine (ALLEGRA) 180 MG tablet Take 180 mg by mouth every morning.     Marland Kitchen FORMIC ACID EX Take 1 mg by mouth at bedtime.     . Glucosamine-Chondroit-Vit C-Mn (GLUCOSAMINE CHONDR 1500 COMPLX PO) Take 1 capsule by mouth every morning.     . lansoprazole (PREVACID) 15 MG capsule Take 15 mg by mouth every evening.     . metFORMIN (GLUMETZA) 500 MG (MOD) 24 hr tablet Take 500 mg by mouth 3 (three) times daily with meals.     . METHOTREXATE, ANTI-RHEUMATIC, PO Inject 1 each into the muscle once a week. Friday-- "8 on the needle"    . metoprolol tartrate (LOPRESSOR) 25 MG tablet TAKE 1 TABLET TWICE A DAY (NEED TO CONTACT OFFICE TO SCHEDULE APPOINTMENT FOR FUTURE REFILLS AT [phone]) 30 tablet 0  . Multiple Vitamins-Minerals (GNP MEGA MULTI FOR MEN PO) Take 1 tablet by mouth every morning.     . nitroGLYCERIN (NITROSTAT) 0.4 MG SL tablet Place 1 tablet (0.4 mg total) under the tongue every 5 (five) minutes as needed for chest pain. 25 tablet 3  . ramipril (ALTACE) 2.5 MG capsule TAKE 1 CAPSULE DAILY (NEED TO CONTACT OFFICE TO SCHEDULE APPOINTMENT FOR FURTHER REFILLS [phone]) 15 capsule 0  . REMICADE 100 MG injection Inject 100 mg into the vein every 8 (eight) weeks.     . sitaGLIPtin (JANUVIA) 100 MG tablet Take 100 mg by mouth every evening.     . sodium chloride (OCEAN) 0.65 % SOLN nasal spray Place 1 spray into both nostrils as needed for congestion.     No current facility-administered medications for this visit.     Review of Systems : See HPI for pertinent positives and negatives.  Physical Examination  Vitals:   03/01/16 1517 03/01/16 1520 03/01/16 1522  BP: (!) 142/76 111/75 133/77  Pulse: 84    Resp: 18    Temp: 97.4 F (36.3 C)    TempSrc: Oral    SpO2: 97%    Weight: 212 lb (96.2 kg)    Height: 6'  0.25" (1.835 m)     Body mass index is 28.55 kg/m.  General: WDWN male in NAD GAIT: normal Eyes: PERRLA Pulmonary: Respirations are non-labored, CTAB, good air movement.  Cardiac: regular rhythm,  no detected murmur.  VASCULAR EXAM Carotid Bruits Right Left   positive negative    Aorta is not palpable. Radial pulses are 1+ palpable on the right and 2+ palpable on the left.  LE Pulses Right Left       POPLITEAL  not palpable  2+ palpable       DORSALIS PEDIS      ANTERIOR TIBIAL  palpable  palpable    Gastrointestinal: soft, nontender, BS WNL, no r/g,  no palpable masses.  Musculoskeletal: No muscle atrophy/wasting. M/S 5/5 throughout, extremities without ischemic changes.  Neurologic: A&O X 3; Appropriate Affect, Speech is normal CN 2-12 intact, pain and light touch intact in extremities, Motor exam as listed above.    Non-Invasive Vascular Imaging CAROTID DUPLEX 03/01/2016   CEREBROVASCULAR DUPLEX EVALUATION    INDICATION: Carotid artery disease     PREVIOUS INTERVENTION(S): Left carotid endarterectomy on 05/10/2005; Right carotid endarterectomy 06/18/2007; Right subclavian artery stent 02/06/2010    DUPLEX EXAM:     RIGHT  LEFT  Peak Systolic Velocities (cm/s) End Diastolic Velocities (cm/s) Plaque LOCATION Peak Systolic Velocities (cm/s) End Diastolic Velocities (cm/s) Plaque  93 16  CCA PROXIMAL 85 15   82 18 HT CCA MID 123 24 HT  106 19  CCA DISTAL 121 24 HT  227 16 HT ECA 132 12 HT  87 11 HT ICA PROXIMAL 94 11 HT  66 21  ICA MID 76 20   91 26  ICA DISTAL 85 28      ICA / CCA Ratio (PSV)   Antegrade  Vertebral Flow Antegrade    Brachial Systolic Pressure (mmHg)   Biphasic (Subclavian artery) Brachial Artery Waveforms Multiphasic (Subclavian artery)    Plaque Morphology:  HM = Homogeneous, HT =  Heterogeneous, CP = Calcific Plaque, SP = Smooth Plaque, IP = Irregular Plaque     ADDITIONAL FINDINGS: Right subclavian artery velocity of 183 cm/s, based on limited visualization due to anatomic limitations.     IMPRESSION: Patent bilateral carotid endarterectomy sites with evidence of mild hyperplasia in the surgical bulbs, velocities suggest <40%. Patent right subclavian artery stent where visualized.    Compared to the previous exam:  No significant change in comparison to the last exam on 03/14/2015.      Assessment: Colin Parks is a 65 y.o. male who is s/p left carotid endarterectomy in 2006, right carotid endarterectomy in 2008, and right subclavian artery stenting in 2011. He remains asymptomatic. He did have a preoperative TIA before the left CEA as manifested by slurred speech and dizziness with standing. He denies any subsequent TIA's or stroke. This was non-debilitating to him.  Pt denies tingling, numbness, pain, or cold sensation in either upper extremity.   Today's carotid artery Duplex suggests bilateral carotid endarterectomy sites with evidence of mild hyperplasia in the surgical bulbs, velocities suggest <40%. Patent right subclavian artery stent where visualized.  His atherosclerotic risk factors include DM that is almost in control and former smoker (quit in the mid 1970's). He is taking ASA, a statin, and Plavix. He is exercising 3 days/week in a gym.   Plan: Follow-up in 1 year with Carotid Duplex scan.   I discussed in depth with the patient the nature of atherosclerosis, and emphasized the importance of maximal medical management including strict control of blood pressure, blood glucose, and lipid levels, obtaining regular exercise, and continued cessation of smoking.  The patient is aware that without maximal medical management the underlying atherosclerotic disease process will progress, limiting the benefit of any interventions. The patient was given  information about stroke prevention and what symptoms should prompt the patient to seek immediate medical care. Thank you for allowing [place] to participate  in this patient's care.  Clemon Chambers, RN, MSN, FNP-C [facility] Office: [phone]  Clinic Physician: Donzetta Matters  03/01/16 3:26 PM

## 2016-03-12 DIAGNOSIS — I10 Essential (primary) hypertension: Secondary | ICD-10-CM | POA: Diagnosis not present

## 2016-03-12 DIAGNOSIS — M069 Rheumatoid arthritis, unspecified: Secondary | ICD-10-CM | POA: Diagnosis not present

## 2016-03-12 DIAGNOSIS — Z7984 Long term (current) use of oral hypoglycemic drugs: Secondary | ICD-10-CM | POA: Diagnosis not present

## 2016-03-12 DIAGNOSIS — E7800 Pure hypercholesterolemia, unspecified: Secondary | ICD-10-CM | POA: Diagnosis not present

## 2016-03-12 DIAGNOSIS — E113299 Type 2 diabetes mellitus with mild nonproliferative diabetic retinopathy without macular edema, unspecified eye: Secondary | ICD-10-CM | POA: Diagnosis not present

## 2016-03-12 DIAGNOSIS — E1140 Type 2 diabetes mellitus with diabetic neuropathy, unspecified: Secondary | ICD-10-CM | POA: Diagnosis not present

## 2016-03-18 ENCOUNTER — Ambulatory Visit: Payer: 59 | Admitting: Family

## 2016-03-18 ENCOUNTER — Encounter (HOSPITAL_COMMUNITY): Payer: 59

## 2016-03-25 DIAGNOSIS — Z79899 Other long term (current) drug therapy: Secondary | ICD-10-CM | POA: Diagnosis not present

## 2016-03-25 DIAGNOSIS — M0579 Rheumatoid arthritis with rheumatoid factor of multiple sites without organ or systems involvement: Secondary | ICD-10-CM | POA: Diagnosis not present

## 2016-03-26 DIAGNOSIS — Z79899 Other long term (current) drug therapy: Secondary | ICD-10-CM | POA: Diagnosis not present

## 2016-03-26 DIAGNOSIS — M0579 Rheumatoid arthritis with rheumatoid factor of multiple sites without organ or systems involvement: Secondary | ICD-10-CM | POA: Diagnosis not present

## 2016-04-30 DIAGNOSIS — Z23 Encounter for immunization: Secondary | ICD-10-CM | POA: Diagnosis not present

## 2016-05-21 DIAGNOSIS — Z79899 Other long term (current) drug therapy: Secondary | ICD-10-CM | POA: Diagnosis not present

## 2016-05-21 DIAGNOSIS — M0579 Rheumatoid arthritis with rheumatoid factor of multiple sites without organ or systems involvement: Secondary | ICD-10-CM | POA: Diagnosis not present

## 2016-06-14 NOTE — Addendum Note (Signed)
Addended by: Lianne Cure A on: 06/14/2016 03:03 PM   Modules accepted: Orders

## 2016-07-16 DIAGNOSIS — M0579 Rheumatoid arthritis with rheumatoid factor of multiple sites without organ or systems involvement: Secondary | ICD-10-CM | POA: Diagnosis not present

## 2016-07-16 DIAGNOSIS — Z79899 Other long term (current) drug therapy: Secondary | ICD-10-CM | POA: Diagnosis not present

## 2016-07-25 DIAGNOSIS — M0579 Rheumatoid arthritis with rheumatoid factor of multiple sites without organ or systems involvement: Secondary | ICD-10-CM | POA: Diagnosis not present

## 2016-07-25 DIAGNOSIS — R739 Hyperglycemia, unspecified: Secondary | ICD-10-CM | POA: Diagnosis not present

## 2016-07-25 DIAGNOSIS — Z6828 Body mass index (BMI) 28.0-28.9, adult: Secondary | ICD-10-CM | POA: Diagnosis not present

## 2016-07-25 DIAGNOSIS — Z79899 Other long term (current) drug therapy: Secondary | ICD-10-CM | POA: Diagnosis not present

## 2016-07-25 DIAGNOSIS — E663 Overweight: Secondary | ICD-10-CM | POA: Diagnosis not present

## 2016-07-25 DIAGNOSIS — M79642 Pain in left hand: Secondary | ICD-10-CM | POA: Diagnosis not present

## 2016-08-13 DIAGNOSIS — I739 Peripheral vascular disease, unspecified: Secondary | ICD-10-CM | POA: Diagnosis not present

## 2016-08-13 DIAGNOSIS — N400 Enlarged prostate without lower urinary tract symptoms: Secondary | ICD-10-CM | POA: Diagnosis not present

## 2016-08-13 DIAGNOSIS — M069 Rheumatoid arthritis, unspecified: Secondary | ICD-10-CM | POA: Diagnosis not present

## 2016-08-13 DIAGNOSIS — I10 Essential (primary) hypertension: Secondary | ICD-10-CM | POA: Diagnosis not present

## 2016-08-13 DIAGNOSIS — E119 Type 2 diabetes mellitus without complications: Secondary | ICD-10-CM | POA: Diagnosis not present

## 2016-08-13 DIAGNOSIS — Z23 Encounter for immunization: Secondary | ICD-10-CM | POA: Diagnosis not present

## 2016-08-13 DIAGNOSIS — Z1159 Encounter for screening for other viral diseases: Secondary | ICD-10-CM | POA: Diagnosis not present

## 2016-08-13 DIAGNOSIS — K219 Gastro-esophageal reflux disease without esophagitis: Secondary | ICD-10-CM | POA: Diagnosis not present

## 2016-08-13 DIAGNOSIS — E7800 Pure hypercholesterolemia, unspecified: Secondary | ICD-10-CM | POA: Diagnosis not present

## 2016-08-13 DIAGNOSIS — E113299 Type 2 diabetes mellitus with mild nonproliferative diabetic retinopathy without macular edema, unspecified eye: Secondary | ICD-10-CM | POA: Diagnosis not present

## 2016-08-13 DIAGNOSIS — I708 Atherosclerosis of other arteries: Secondary | ICD-10-CM | POA: Diagnosis not present

## 2016-08-13 DIAGNOSIS — Z0000 Encounter for general adult medical examination without abnormal findings: Secondary | ICD-10-CM | POA: Diagnosis not present

## 2016-08-13 DIAGNOSIS — Z1389 Encounter for screening for other disorder: Secondary | ICD-10-CM | POA: Diagnosis not present

## 2016-08-13 DIAGNOSIS — Z794 Long term (current) use of insulin: Secondary | ICD-10-CM | POA: Diagnosis not present

## 2016-08-13 DIAGNOSIS — M1990 Unspecified osteoarthritis, unspecified site: Secondary | ICD-10-CM | POA: Diagnosis not present

## 2016-09-10 DIAGNOSIS — M0579 Rheumatoid arthritis with rheumatoid factor of multiple sites without organ or systems involvement: Secondary | ICD-10-CM | POA: Diagnosis not present

## 2016-09-10 DIAGNOSIS — Z79899 Other long term (current) drug therapy: Secondary | ICD-10-CM | POA: Diagnosis not present

## 2016-10-24 DIAGNOSIS — M0579 Rheumatoid arthritis with rheumatoid factor of multiple sites without organ or systems involvement: Secondary | ICD-10-CM | POA: Diagnosis not present

## 2016-11-12 DIAGNOSIS — E113293 Type 2 diabetes mellitus with mild nonproliferative diabetic retinopathy without macular edema, bilateral: Secondary | ICD-10-CM | POA: Diagnosis not present

## 2016-11-12 DIAGNOSIS — H52223 Regular astigmatism, bilateral: Secondary | ICD-10-CM | POA: Diagnosis not present

## 2016-11-12 DIAGNOSIS — H524 Presbyopia: Secondary | ICD-10-CM | POA: Diagnosis not present

## 2016-11-12 DIAGNOSIS — H43813 Vitreous degeneration, bilateral: Secondary | ICD-10-CM | POA: Diagnosis not present

## 2016-11-12 DIAGNOSIS — H2513 Age-related nuclear cataract, bilateral: Secondary | ICD-10-CM | POA: Diagnosis not present

## 2016-11-12 DIAGNOSIS — H35033 Hypertensive retinopathy, bilateral: Secondary | ICD-10-CM | POA: Diagnosis not present

## 2016-11-12 DIAGNOSIS — H5213 Myopia, bilateral: Secondary | ICD-10-CM | POA: Diagnosis not present

## 2016-11-25 DIAGNOSIS — Z79899 Other long term (current) drug therapy: Secondary | ICD-10-CM | POA: Diagnosis not present

## 2016-11-25 DIAGNOSIS — M7989 Other specified soft tissue disorders: Secondary | ICD-10-CM | POA: Diagnosis not present

## 2016-11-25 DIAGNOSIS — M0579 Rheumatoid arthritis with rheumatoid factor of multiple sites without organ or systems involvement: Secondary | ICD-10-CM | POA: Diagnosis not present

## 2016-11-25 DIAGNOSIS — E663 Overweight: Secondary | ICD-10-CM | POA: Diagnosis not present

## 2016-11-25 DIAGNOSIS — Z6828 Body mass index (BMI) 28.0-28.9, adult: Secondary | ICD-10-CM | POA: Diagnosis not present

## 2016-12-19 DIAGNOSIS — Z79899 Other long term (current) drug therapy: Secondary | ICD-10-CM | POA: Diagnosis not present

## 2016-12-19 DIAGNOSIS — M0579 Rheumatoid arthritis with rheumatoid factor of multiple sites without organ or systems involvement: Secondary | ICD-10-CM | POA: Diagnosis not present

## 2017-01-17 DIAGNOSIS — J069 Acute upper respiratory infection, unspecified: Secondary | ICD-10-CM | POA: Diagnosis not present

## 2017-01-27 DIAGNOSIS — M069 Rheumatoid arthritis, unspecified: Secondary | ICD-10-CM | POA: Diagnosis not present

## 2017-01-27 DIAGNOSIS — I10 Essential (primary) hypertension: Secondary | ICD-10-CM | POA: Diagnosis not present

## 2017-01-27 DIAGNOSIS — E113299 Type 2 diabetes mellitus with mild nonproliferative diabetic retinopathy without macular edema, unspecified eye: Secondary | ICD-10-CM | POA: Diagnosis not present

## 2017-01-27 DIAGNOSIS — E1165 Type 2 diabetes mellitus with hyperglycemia: Secondary | ICD-10-CM | POA: Diagnosis not present

## 2017-01-27 DIAGNOSIS — E1140 Type 2 diabetes mellitus with diabetic neuropathy, unspecified: Secondary | ICD-10-CM | POA: Diagnosis not present

## 2017-01-27 DIAGNOSIS — I739 Peripheral vascular disease, unspecified: Secondary | ICD-10-CM | POA: Diagnosis not present

## 2017-01-27 DIAGNOSIS — E7800 Pure hypercholesterolemia, unspecified: Secondary | ICD-10-CM | POA: Diagnosis not present

## 2017-01-27 DIAGNOSIS — I708 Atherosclerosis of other arteries: Secondary | ICD-10-CM | POA: Diagnosis not present

## 2017-02-13 DIAGNOSIS — Z79899 Other long term (current) drug therapy: Secondary | ICD-10-CM | POA: Diagnosis not present

## 2017-02-13 DIAGNOSIS — M0579 Rheumatoid arthritis with rheumatoid factor of multiple sites without organ or systems involvement: Secondary | ICD-10-CM | POA: Diagnosis not present

## 2017-02-18 ENCOUNTER — Encounter: Payer: Self-pay | Admitting: Family

## 2017-03-07 ENCOUNTER — Ambulatory Visit (HOSPITAL_COMMUNITY)
Admission: RE | Admit: 2017-03-07 | Discharge: 2017-03-07 | Disposition: A | Discharge status: Home / Self Care | Payer: Medicare Other | Source: Ambulatory Visit | Attending: Family | Admitting: Family

## 2017-03-07 ENCOUNTER — Ambulatory Visit (INDEPENDENT_AMBULATORY_CARE_PROVIDER_SITE_OTHER): Payer: Medicare Other | Admitting: Family

## 2017-03-07 ENCOUNTER — Encounter: Payer: Self-pay | Admitting: Family

## 2017-03-07 VITALS — BP 146/81 | HR 88 | Temp 97.5°F | Resp 18 | Ht 72.0 in | Wt 213.0 lb

## 2017-03-07 DIAGNOSIS — Z48812 Encounter for surgical aftercare following surgery on the circulatory system: Secondary | ICD-10-CM | POA: Diagnosis not present

## 2017-03-07 DIAGNOSIS — Z959 Presence of cardiac and vascular implant and graft, unspecified: Secondary | ICD-10-CM

## 2017-03-07 DIAGNOSIS — I771 Stricture of artery: Secondary | ICD-10-CM | POA: Insufficient documentation

## 2017-03-07 DIAGNOSIS — Z9889 Other specified postprocedural states: Secondary | ICD-10-CM

## 2017-03-07 DIAGNOSIS — I6523 Occlusion and stenosis of bilateral carotid arteries: Secondary | ICD-10-CM | POA: Insufficient documentation

## 2017-03-07 DIAGNOSIS — Z87891 Personal history of nicotine dependence: Secondary | ICD-10-CM

## 2017-03-07 LAB — VAS US CAROTID
LCCADDIAS: -25 cm/s
LCCAPSYS: 124 cm/s
LEFT ECA DIAS: -12 cm/s
LEFT VERTEBRAL DIAS: 12 cm/s
LICAPDIAS: -28 cm/s
LICAPSYS: -127 cm/s
Left CCA dist sys: -159 cm/s
Left CCA prox dias: 23 cm/s
Left ICA dist dias: -17 cm/s
Left ICA dist sys: -60 cm/s
RIGHT CCA MID DIAS: 16 cm/s
RIGHT ECA DIAS: -24 cm/s
RIGHT VERTEBRAL DIAS: 12 cm/s
Right CCA prox dias: 16 cm/s
Right CCA prox sys: 112 cm/s
Right cca dist sys: -77 cm/s

## 2017-03-07 NOTE — Patient Instructions (Signed)
Stroke Prevention Some medical conditions and behaviors are associated with an increased chance of having a stroke. You may prevent a stroke by making healthy choices and managing medical conditions. How can I reduce my risk of having a stroke?  Stay physically active. Get at least 30 minutes of activity on most or all days.  Do not smoke. It may also be helpful to avoid exposure to secondhand smoke.  Limit alcohol use. Moderate alcohol use is considered to be: ? No more than 2 drinks per day for men. ? No more than 1 drink per day for nonpregnant women.  Eat healthy foods. This involves: ? Eating 5 or more servings of fruits and vegetables a day. ? Making dietary changes that address high blood pressure (hypertension), high cholesterol, diabetes, or obesity.  Manage your cholesterol levels. ? Making food choices that are high in fiber and low in saturated fat, trans fat, and cholesterol may control cholesterol levels. ? Take any prescribed medicines to control cholesterol as directed by your health care provider.  Manage your diabetes. ? Controlling your carbohydrate and sugar intake is recommended to manage diabetes. ? Take any prescribed medicines to control diabetes as directed by your health care provider.  Control your hypertension. ? Making food choices that are low in salt (sodium), saturated fat, trans fat, and cholesterol is recommended to manage hypertension. ? Ask your health care provider if you need treatment to lower your blood pressure. Take any prescribed medicines to control hypertension as directed by your health care provider. ? If you are 18-39 years of age, have your blood pressure checked every 3-5 years. If you are 40 years of age or older, have your blood pressure checked every year.  Maintain a healthy weight. ? Reducing calorie intake and making food choices that are low in sodium, saturated fat, trans fat, and cholesterol are recommended to manage  weight.  Stop drug abuse.  Avoid taking birth control pills. ? Talk to your health care provider about the risks of taking birth control pills if you are over 35 years old, smoke, get migraines, or have ever had a blood clot.  Get evaluated for sleep disorders (sleep apnea). ? Talk to your health care provider about getting a sleep evaluation if you snore a lot or have excessive sleepiness.  Take medicines only as directed by your health care provider. ? For some people, aspirin or blood thinners (anticoagulants) are helpful in reducing the risk of forming abnormal blood clots that can lead to stroke. If you have the irregular heart rhythm of atrial fibrillation, you should be on a blood thinner unless there is a good reason you cannot take them. ? Understand all your medicine instructions.  Make sure that other conditions (such as anemia or atherosclerosis) are addressed. Get help right away if:  You have sudden weakness or numbness of the face, arm, or leg, especially on one side of the body.  Your face or eyelid droops to one side.  You have sudden confusion.  You have trouble speaking (aphasia) or understanding.  You have sudden trouble seeing in one or both eyes.  You have sudden trouble walking.  You have dizziness.  You have a loss of balance or coordination.  You have a sudden, severe headache with no known cause.  You have new chest pain or an irregular heartbeat. Any of these symptoms may represent a serious problem that is an emergency. Do not wait to see if the symptoms will go away.   Get medical help at once. Call your local emergency services (911 in [place] Do not drive yourself to the hospital. This information is not intended to replace advice given to you by your health care provider. Make sure you discuss any questions you have with your health care provider. Document Released: 08/22/2004 Document Revised: 12/21/2015 Document Reviewed: 01/15/2013 Elsevier  Interactive Patient Education  2017 Elsevier Inc.     Preventing Cerebrovascular Disease Arteries are blood vessels that carry blood that contains oxygen from the heart to all parts of the body. Cerebrovascular disease affects arteries that supply the brain. Any condition that blocks or disrupts blood flow to the brain can cause cerebrovascular disease. Brain cells that lose blood supply start to die within minutes (stroke). Stroke is the main danger of cerebrovascular disease. Atherosclerosis and high blood pressure are common causes of cerebrovascular disease. Atherosclerosis is narrowing and hardening of an artery that results when fat, cholesterol, calcium, or other substances (plaque) build up inside an artery. Plaque reduces blood flow through the artery. High blood pressure increases the risk of bleeding inside the brain. Making diet and lifestyle changes to prevent atherosclerosis and high blood pressure lowers your risk of cerebrovascular disease. What nutrition changes can be made?  Eat more fruits, vegetables, and whole grains.  Reduce how much saturated fat you eat. To do this, eat less red meat and fewer full-fat dairy products.  Eat healthy proteins instead of red meat. Healthy proteins include: ? Fish. Eat fish that contains heart-healthy omega-3 fatty acids, twice a week. Examples include salmon, albacore tuna, mackerel, and herring. ? Chicken. ? Nuts. ? Low-fat or nonfat yogurt.  Avoid processed meats, like bacon and lunchmeat.  Avoid foods that contain: ? A lot of sugar, such as sweets and drinks with added sugar. ? A lot of salt (sodium). Avoid adding extra salt to your food, as told by your health care provider. ? Trans fats, such as margarine and baked goods. Trans fats may be listed as "partially hydrogenated oils" on food labels.  Check food labels to see how much sodium, sugar, and trans fats are in foods.  Use vegetable oils that contain low amounts of  saturated fat, such as olive oil or canola oil. What lifestyle changes can be made?  Drink alcohol in moderation. This means no more than 1 drink a day for nonpregnant women and 2 drinks a day for men. One drink equals 12 oz of beer, 5 oz of wine, or 1 oz of hard liquor.  If you are overweight, ask your health care provider to recommend a weight-loss plan for you. Losing 5-10 lb (2.2-4.5 kg) can reduce your risk of diabetes, atherosclerosis, and high blood pressure.  Exercise for 30?60 minutes on most days, or as much as told by your health care provider. ? Do moderate-intensity exercise, such as brisk walking, bicycling, and water aerobics. Ask your health care provider which activities are safe for you.  Do not use any products that contain nicotine or tobacco, such as cigarettes and e-cigarettes. If you need help quitting, ask your health care provider. Why are these changes important? Making these changes lowers your risk of many diseases that can cause cerebrovascular disease and stroke. Stroke is a leading cause of death and disability. Making these changes also improves your overall health and quality of life. What can I do to lower my risk? The following factors make you more likely to develop cerebrovascular disease:  Being overweight.  Smoking.  Being physically inactive.    Eating a high-fat diet.  Having certain health conditions, such as: ? Diabetes. ? High blood pressure. ? Heart disease. ? Atherosclerosis. ? High cholesterol. ? Sickle cell disease.  Talk with your health care provider about your risk for cerebrovascular disease. Work with your health care provider to control diseases that you have that may contribute to cerebrovascular disease. Your health care provider may prescribe medicines to help prevent major causes of cerebrovascular disease. Where to find more information: Learn more about preventing cerebrovascular disease from:  National Heart, Lung, and  Blood Institute: www.nhlbi.nih.gov/health/health-topics/topics/stroke  Centers for Disease Control and Prevention: cdc.gov/stroke/about.htm  Summary  Cerebrovascular disease can lead to a stroke.  Atherosclerosis and high blood pressure are major causes of cerebrovascular disease.  Making diet and lifestyle changes can reduce your risk of cerebrovascular disease.  Work with your health care provider to get your risk factors under control to reduce your risk of cerebrovascular disease. This information is not intended to replace advice given to you by your health care provider. Make sure you discuss any questions you have with your health care provider. Document Released: 07/30/2015 Document Revised: 02/02/2016 Document Reviewed: 07/30/2015 Elsevier Interactive Patient Education  2018 Elsevier Inc.  

## 2017-03-07 NOTE — Progress Notes (Signed)
Chief Complaint: Follow up Extracranial Carotid Artery Stenosis   History of Present Illness  Colin Parks is a 66 y.o. male patient of Dr. Donnetta Hutching presents today for followup of extracranial cerebrovascular disease. He underwent left carotid endarterectomy in 2006, right carotid endarterectomy in 2008, and right subclavian artery stenting in 2011. He remains asymptomatic. He did have a preoperative stroke before the left CEA as manifested by slurred speech, dizziness with standing, and right hemiparesis; he denies any residual neurological deficit. He denies any subsequent TIA's or stroke. This was non-debilitating to him.  Pt denies tingling, numbness, pain, or cold sensation in either upper extremity.   He also has extensive coronary disease reports that he is stable from this as well. He had a 5 vessel CABG in 2013, denies any history of MI. He has rheumatoid arthritis and receives Remicaide infusions every 8 weeks, also takes methotrexate.   He denies claudication symptoms with walking.  Pt states his blood pressure at home is 120's/60-70  Pt Diabetic: yes, states his last A1C was 6.9 Pt smoker: former smoker, quit in 1975  Pt meds include: Statin : yes ASA: yes Other anticoagulants/antiplatelets: Plavix    Past Medical History:  Diagnosis Date  . Arthritis   . Atrial fibrillation ([place])   . Carotid artery occlusion   . Coronary artery disease   . Diabetes mellitus   . Hyperlipidemia   . Hypertension   . PVD (peripheral vascular disease) ([place])   . RA (rheumatoid arthritis) ([place]) 05/20/2013  . Stroke [facility]) 2006  . Type II or unspecified type diabetes mellitus with peripheral circulatory disorders, not stated as uncontrolled(250.70) 05/20/2013    Social History Social History  Substance Use Topics  . Smoking status: Former Smoker    Quit date: 08/07/1971  . Smokeless tobacco: Former Systems developer  . Alcohol use Yes     Comment: one beer once a month    Family  History Family History  Problem Relation Age of Onset  . Heart disease Mother   . Hypertension Mother   . Hyperlipidemia Mother   . Heart disease Father   . Vision loss Sister   . Peripheral vascular disease Brother     Surgical History Past Surgical History:  Procedure Laterality Date  . ANGIOPLASTY  05/10/2005   Dacron patch  . ANKLE SURGERY  left ankle 2002  . CARDIAC CATHETERIZATION  02/06/2010   Aortic Arch - Second order catheterization (right subclavian artery)  . CAROTID ENDARTERECTOMY  05/10/2005   Left  . CAROTID ENDARTERECTOMY Right 2009   Dr. Donnetta Hutching  . CAROTID STENT  02/06/2010   right subclavian artery  . COLONOSCOPY WITH PROPOFOL N/A 11/08/2014   Procedure: COLONOSCOPY WITH PROPOFOL;  Surgeon: Garlan Fair, MD;  Location: [facility];  Service: Endoscopy;  Laterality: N/A;  . CORONARY ARTERY BYPASS GRAFT  05/22/2012   Procedure: CORONARY ARTERY BYPASS GRAFTING (CABG);  Surgeon: Melrose Nakayama, MD;  Location: [place];  Service: Open Heart Surgery;  Laterality: N/A;  times five, using Left Internal Mammary Artery and Right Greater Saphenous Vein Graft harvested Endoscopically  . ENDOVEIN HARVEST OF GREATER SAPHENOUS VEIN  05/22/2012   Procedure: ENDOVEIN HARVEST OF GREATER SAPHENOUS VEIN;  Surgeon: Melrose Nakayama, MD;  Location: [place];  Service: Open Heart Surgery;  Laterality: Right;  . HAND SURGERY  right hand cyst removed from knuckle per pt.    2008  . KNEE SURGERY  left knee 2004  . LEFT HEART CATHETERIZATION WITH CORONARY  ANGIOGRAM N/A 05/14/2012   Procedure: LEFT HEART CATHETERIZATION WITH CORONARY ANGIOGRAM;  Surgeon: Candee Furbish, MD;  Location[facility] C[facility] LAB;  Service: Cardiovascular;  Laterality: N/A;    No Known Allergies  Current Outpatient Prescriptions  Medication Sig Dispense Refill  . acetaminophen (TYLENOL) 500 MG tablet Take 500 mg by mouth every 6 (six) hours as needed for moderate pain or headache.     Marland Kitchen aspirin 81 MG EC tablet Take 1  tablet (81 mg total) by mouth daily. Swallow whole. 30 tablet 12  . clopidogrel (PLAVIX) 75 MG tablet Take 1 tablet (75 mg total) by mouth daily.    Marland Kitchen desoximetasone (TOPICORT) 0.25 % cream Apply 0.25 % topically as needed (rash on legs.).     Marland Kitchen ezetimibe-simvastatin (VYTORIN) 10-20 MG per tablet Take 1 tablet by mouth at bedtime.      . fexofenadine (ALLEGRA) 180 MG tablet Take 180 mg by mouth every morning.     Marland Kitchen FORMIC ACID EX Take 1 mg by mouth at bedtime.     . Glucosamine-Chondroit-Vit C-Mn (GLUCOSAMINE CHONDR 1500 COMPLX PO) Take 1 capsule by mouth every morning.     . lansoprazole (PREVACID) 15 MG capsule Take 15 mg by mouth every evening.     . metFORMIN (GLUMETZA) 500 MG (MOD) 24 hr tablet Take 500 mg by mouth 3 (three) times daily with meals.     . METHOTREXATE, ANTI-RHEUMATIC, PO Inject 1 each into the muscle once a week. Friday-- "8 on the needle"    . metoprolol tartrate (LOPRESSOR) 25 MG tablet TAKE 1 TABLET TWICE A DAY (NEED TO CONTACT OFFICE TO SCHEDULE APPOINTMENT FOR FUTURE REFILLS AT [phone]) 30 tablet 0  . Multiple Vitamins-Minerals (GNP MEGA MULTI FOR MEN PO) Take 1 tablet by mouth every morning.     . nitroGLYCERIN (NITROSTAT) 0.4 MG SL tablet Place 1 tablet (0.4 mg total) under the tongue every 5 (five) minutes as needed for chest pain. 25 tablet 3  . ramipril (ALTACE) 2.5 MG capsule TAKE 1 CAPSULE DAILY (NEED TO CONTACT OFFICE TO SCHEDULE APPOINTMENT FOR FURTHER REFILLS [phone]) 15 capsule 0  . REMICADE 100 MG injection Inject 100 mg into the vein every 8 (eight) weeks.     . sitaGLIPtin (JANUVIA) 100 MG tablet Take 100 mg by mouth every evening.     . sodium chloride (OCEAN) 0.65 % SOLN nasal spray Place 1 spray into both nostrils as needed for congestion.     No current facility-administered medications for this visit.     Review of Systems : See HPI for pertinent positives and negatives.  Physical Examination  Vitals:   03/07/17 1037 03/07/17 1041  BP:  (!) 147/83 (!) 146/81  Pulse: 88   Resp: 18   Temp: (!) 97.5 F (36.4 C)   TempSrc: Oral   SpO2: 98%   Weight: 213 lb (96.6 kg)   Height: 6' (1.829 m)    Body mass index is 28.89 kg/m.  General: WDWN male in NAD GAIT:normal Eyes: PERRLA Pulmonary: Respirations are non-labored, CTAB, good air movement.  Cardiac: regular rhythm and rate, no detected murmur.  VASCULAR EXAM Carotid Bruits Right Left   positive negative   Abdominal aortic pulse is not palpable. Radial pulses are 1+ palpable on the right and 2+ palpable on the left.   LE Pulses Right Left  POPLITEAL not palpable 2+ palpable  DORSALIS PEDIS ANTERIOR TIBIAL  palpable  palpable    Gastrointestinal: soft, nontender, BS WNL, no r/g,  no palpable masses.  Musculoskeletal: No muscle atrophy/wasting. M/S 5/5 throughout, extremities without ischemic changes.  Skin: No rashes, no lesions, no ulcers.   Neurologic: A&O X 3; appropriate affect, speech is normal, CN 2-12 intact, pain and light touch intact in extremities, Motor exam as listed above.     Assessment: Colin Parks is a 66 y.o. male who is s/pleft carotid endarterectomy in 2006, right carotid endarterectomy in 2008, and right subclavian artery stenting in 2011. He remains asymptomatic. He did have a preoperative stroke before the left CEA, no residual neurological deficits. He denies any subsequent TIA's or stroke.  Pt denies tingling, numbness, pain, or cold sensation in either upper extremity.   His atherosclerotic risk factors include DM that is currently in control and former smoker (quit in the mid 1970's). He is taking ASA, a statin, and Plavix. He is exercising 3 days/week in a gym.  DATA Carotid Duplex (03/07/17) Bilateral carotid endarterectomy sites with evidence of mild hyperplasia in the surgical bulbs, velocities suggest <40% stenosis. Patent right subclavian artery stent. >50%  right ECA stenosis.  Bilateral vertebral artery flow is antegrade.  Bilateral subclavian artery waveforms are normal.  No significant change compared to the last last exam on 03-01-16.    Plan: Follow-up in 18 months with Carotid Duplex scan.    I discussed in depth with the patient the nature of atherosclerosis, and emphasized the importance of maximal medical management including strict control of blood pressure, blood glucose, and lipid levels, obtaining regular exercise, and continued cessation of smoking.  The patient is aware that without maximal medical management the underlying atherosclerotic disease process will progress, limiting the benefit of any interventions. The patient was given information about stroke prevention and what symptoms should prompt the patient to seek immediate medical care. Thank you for allowing [place] to participate in this patient's care.  Clemon Chambers, RN, MSN, FNP-C [facility] Office: [phone]  Clinic Physician: Cain/Chen  03/07/17 10:46 AM

## 2017-03-17 DIAGNOSIS — S76112A Strain of left quadriceps muscle, fascia and tendon, initial encounter: Secondary | ICD-10-CM | POA: Diagnosis not present

## 2017-03-19 NOTE — Addendum Note (Signed)
Addended by: Lianne Cure A on: 03/19/2017 11:37 AM   Modules accepted: Orders

## 2017-03-25 ENCOUNTER — Ambulatory Visit
Admission: RE | Admit: 2017-03-25 | Discharge: 2017-03-25 | Disposition: A | Discharge status: Home / Self Care | Payer: Medicare Other | Source: Ambulatory Visit | Attending: Internal Medicine | Admitting: Internal Medicine

## 2017-03-25 ENCOUNTER — Other Ambulatory Visit: Payer: Self-pay | Admitting: Internal Medicine

## 2017-03-25 DIAGNOSIS — M25562 Pain in left knee: Secondary | ICD-10-CM

## 2017-03-25 DIAGNOSIS — M25462 Effusion, left knee: Secondary | ICD-10-CM | POA: Diagnosis not present

## 2017-03-27 DIAGNOSIS — M0579 Rheumatoid arthritis with rheumatoid factor of multiple sites without organ or systems involvement: Secondary | ICD-10-CM | POA: Diagnosis not present

## 2017-03-27 DIAGNOSIS — Z79899 Other long term (current) drug therapy: Secondary | ICD-10-CM | POA: Diagnosis not present

## 2017-03-27 DIAGNOSIS — Z6828 Body mass index (BMI) 28.0-28.9, adult: Secondary | ICD-10-CM | POA: Diagnosis not present

## 2017-03-27 DIAGNOSIS — M7989 Other specified soft tissue disorders: Secondary | ICD-10-CM | POA: Diagnosis not present

## 2017-03-27 DIAGNOSIS — E663 Overweight: Secondary | ICD-10-CM | POA: Diagnosis not present

## 2017-03-27 DIAGNOSIS — M1712 Unilateral primary osteoarthritis, left knee: Secondary | ICD-10-CM | POA: Diagnosis not present

## 2017-04-10 DIAGNOSIS — M0579 Rheumatoid arthritis with rheumatoid factor of multiple sites without organ or systems involvement: Secondary | ICD-10-CM | POA: Diagnosis not present

## 2017-06-05 DIAGNOSIS — Z79899 Other long term (current) drug therapy: Secondary | ICD-10-CM | POA: Diagnosis not present

## 2017-06-05 DIAGNOSIS — M0579 Rheumatoid arthritis with rheumatoid factor of multiple sites without organ or systems involvement: Secondary | ICD-10-CM | POA: Diagnosis not present

## 2017-06-25 DIAGNOSIS — Z23 Encounter for immunization: Secondary | ICD-10-CM | POA: Diagnosis not present

## 2017-07-31 DIAGNOSIS — Z79899 Other long term (current) drug therapy: Secondary | ICD-10-CM | POA: Diagnosis not present

## 2017-07-31 DIAGNOSIS — M0579 Rheumatoid arthritis with rheumatoid factor of multiple sites without organ or systems involvement: Secondary | ICD-10-CM | POA: Diagnosis not present

## 2017-08-26 DIAGNOSIS — E663 Overweight: Secondary | ICD-10-CM | POA: Diagnosis not present

## 2017-08-26 DIAGNOSIS — K219 Gastro-esophageal reflux disease without esophagitis: Secondary | ICD-10-CM | POA: Diagnosis not present

## 2017-08-26 DIAGNOSIS — I25810 Atherosclerosis of coronary artery bypass graft(s) without angina pectoris: Secondary | ICD-10-CM | POA: Diagnosis not present

## 2017-08-26 DIAGNOSIS — E7800 Pure hypercholesterolemia, unspecified: Secondary | ICD-10-CM | POA: Diagnosis not present

## 2017-08-26 DIAGNOSIS — E1165 Type 2 diabetes mellitus with hyperglycemia: Secondary | ICD-10-CM | POA: Diagnosis not present

## 2017-08-26 DIAGNOSIS — I739 Peripheral vascular disease, unspecified: Secondary | ICD-10-CM | POA: Diagnosis not present

## 2017-08-26 DIAGNOSIS — E1140 Type 2 diabetes mellitus with diabetic neuropathy, unspecified: Secondary | ICD-10-CM | POA: Diagnosis not present

## 2017-08-26 DIAGNOSIS — I639 Cerebral infarction, unspecified: Secondary | ICD-10-CM | POA: Diagnosis not present

## 2017-08-26 DIAGNOSIS — Z79899 Other long term (current) drug therapy: Secondary | ICD-10-CM | POA: Diagnosis not present

## 2017-08-26 DIAGNOSIS — M7989 Other specified soft tissue disorders: Secondary | ICD-10-CM | POA: Diagnosis not present

## 2017-08-26 DIAGNOSIS — E113299 Type 2 diabetes mellitus with mild nonproliferative diabetic retinopathy without macular edema, unspecified eye: Secondary | ICD-10-CM | POA: Diagnosis not present

## 2017-08-26 DIAGNOSIS — Z6829 Body mass index (BMI) 29.0-29.9, adult: Secondary | ICD-10-CM | POA: Diagnosis not present

## 2017-08-26 DIAGNOSIS — Z0000 Encounter for general adult medical examination without abnormal findings: Secondary | ICD-10-CM | POA: Diagnosis not present

## 2017-08-26 DIAGNOSIS — M1990 Unspecified osteoarthritis, unspecified site: Secondary | ICD-10-CM | POA: Diagnosis not present

## 2017-08-26 DIAGNOSIS — Z1389 Encounter for screening for other disorder: Secondary | ICD-10-CM | POA: Diagnosis not present

## 2017-08-26 DIAGNOSIS — N400 Enlarged prostate without lower urinary tract symptoms: Secondary | ICD-10-CM | POA: Diagnosis not present

## 2017-08-26 DIAGNOSIS — I10 Essential (primary) hypertension: Secondary | ICD-10-CM | POA: Diagnosis not present

## 2017-08-26 DIAGNOSIS — M0579 Rheumatoid arthritis with rheumatoid factor of multiple sites without organ or systems involvement: Secondary | ICD-10-CM | POA: Diagnosis not present

## 2017-08-26 DIAGNOSIS — M069 Rheumatoid arthritis, unspecified: Secondary | ICD-10-CM | POA: Diagnosis not present

## 2017-08-26 DIAGNOSIS — I708 Atherosclerosis of other arteries: Secondary | ICD-10-CM | POA: Diagnosis not present

## 2017-09-25 DIAGNOSIS — Z79899 Other long term (current) drug therapy: Secondary | ICD-10-CM | POA: Diagnosis not present

## 2017-09-25 DIAGNOSIS — M0579 Rheumatoid arthritis with rheumatoid factor of multiple sites without organ or systems involvement: Secondary | ICD-10-CM | POA: Diagnosis not present

## 2017-11-04 DIAGNOSIS — Z6829 Body mass index (BMI) 29.0-29.9, adult: Secondary | ICD-10-CM | POA: Diagnosis not present

## 2017-11-04 DIAGNOSIS — M25562 Pain in left knee: Secondary | ICD-10-CM | POA: Diagnosis not present

## 2017-11-04 DIAGNOSIS — E663 Overweight: Secondary | ICD-10-CM | POA: Diagnosis not present

## 2017-11-04 DIAGNOSIS — M0579 Rheumatoid arthritis with rheumatoid factor of multiple sites without organ or systems involvement: Secondary | ICD-10-CM | POA: Diagnosis not present

## 2017-11-04 DIAGNOSIS — Z79899 Other long term (current) drug therapy: Secondary | ICD-10-CM | POA: Diagnosis not present

## 2017-11-20 DIAGNOSIS — Z79899 Other long term (current) drug therapy: Secondary | ICD-10-CM | POA: Diagnosis not present

## 2017-11-20 DIAGNOSIS — M0579 Rheumatoid arthritis with rheumatoid factor of multiple sites without organ or systems involvement: Secondary | ICD-10-CM | POA: Diagnosis not present

## 2018-01-15 DIAGNOSIS — Z79899 Other long term (current) drug therapy: Secondary | ICD-10-CM | POA: Diagnosis not present

## 2018-01-15 DIAGNOSIS — M0579 Rheumatoid arthritis with rheumatoid factor of multiple sites without organ or systems involvement: Secondary | ICD-10-CM | POA: Diagnosis not present

## 2018-01-26 IMAGING — DX DG KNEE 3 VIEWS*L*
3 series · 3 of 3 positions shown · non-contrast
Comparison: Left knee MRI, 10/31/2004.

CLINICAL DATA: Pt twisted Lt knee 2 weeks ago, medial and lateral
Lt knee pain and swelling now

EXAM:
LEFT KNEE - 3 VIEW

[dg knee 3 views left (1 of 3)]
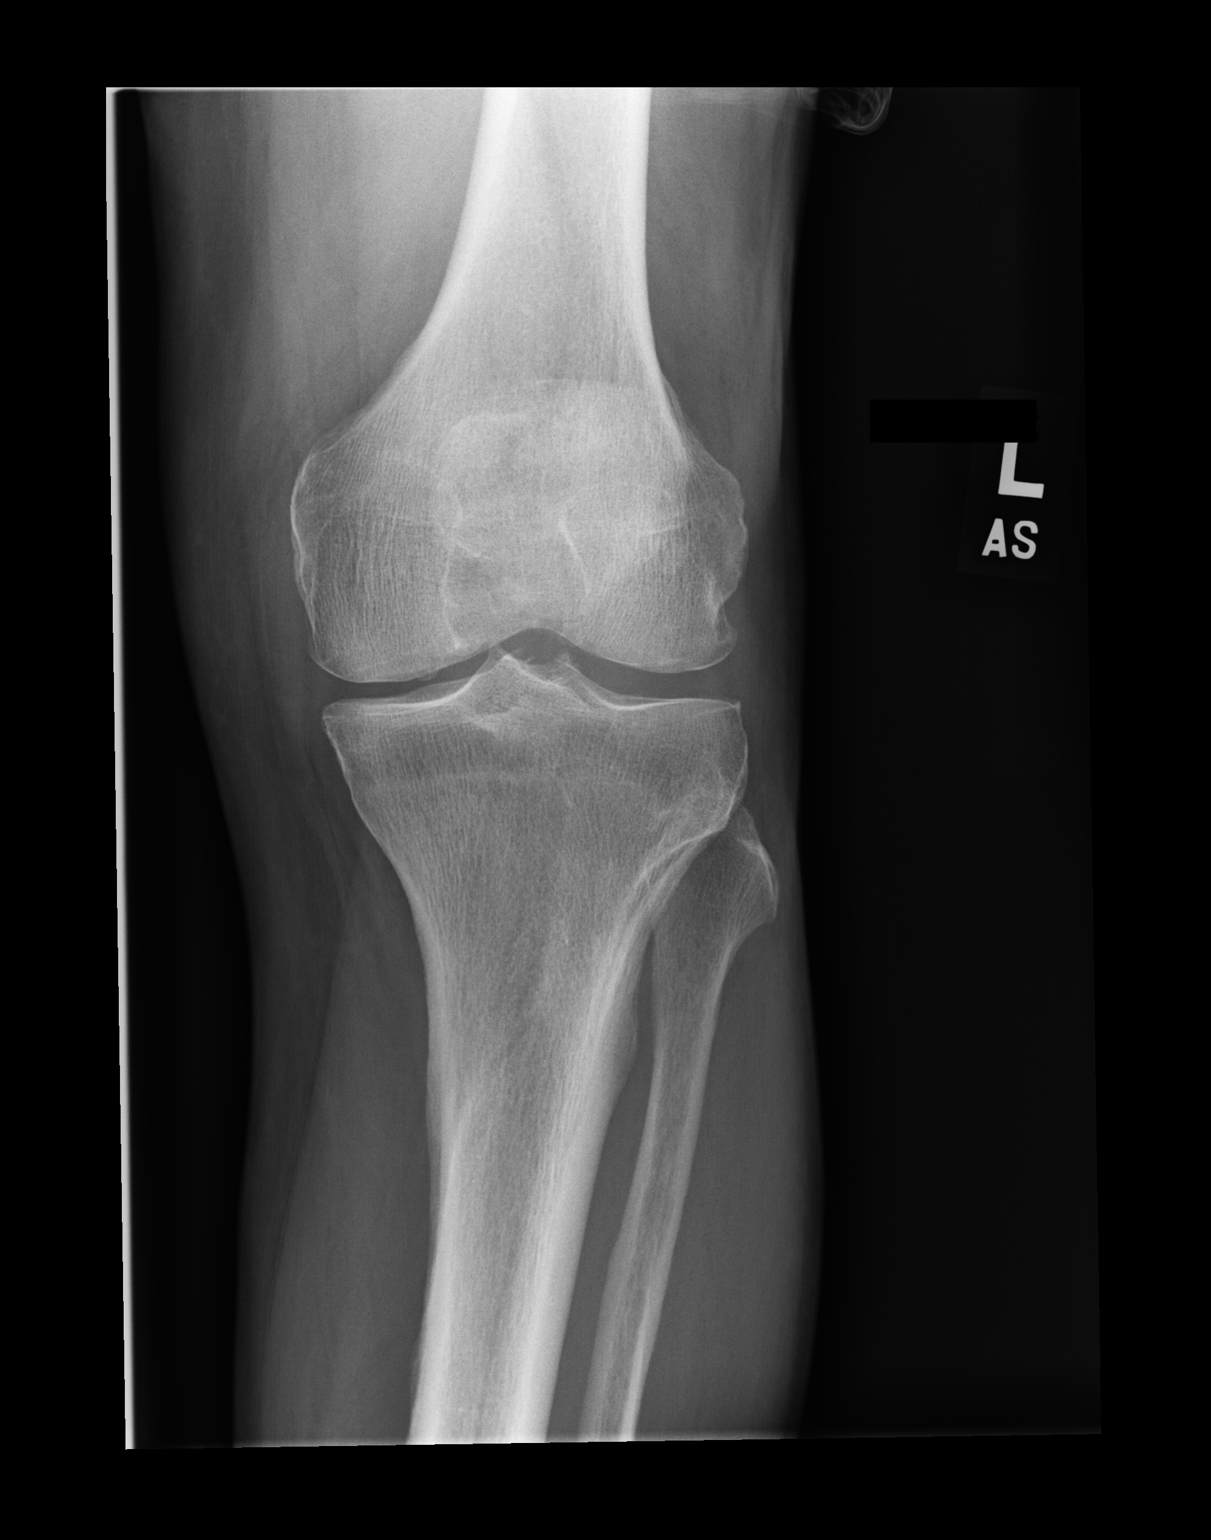

[dg knee 3 views left (2 of 3)]
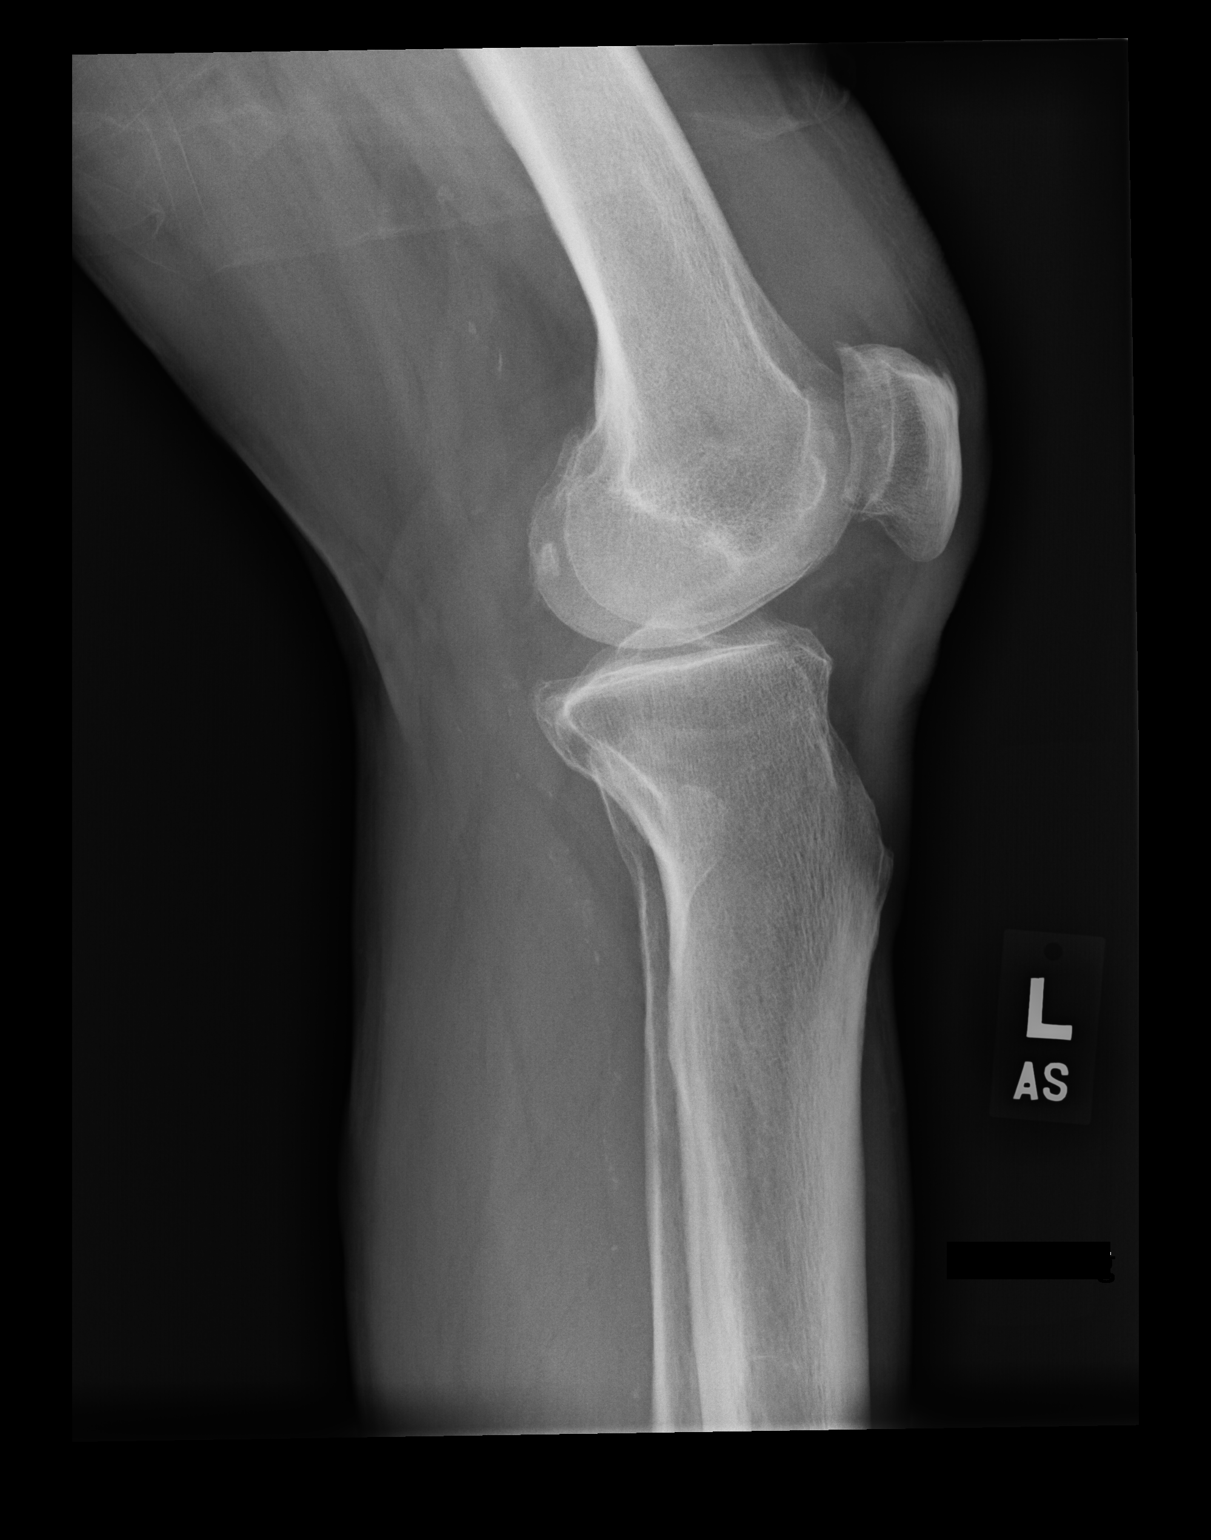

[dg knee 3 views left (3 of 3)]
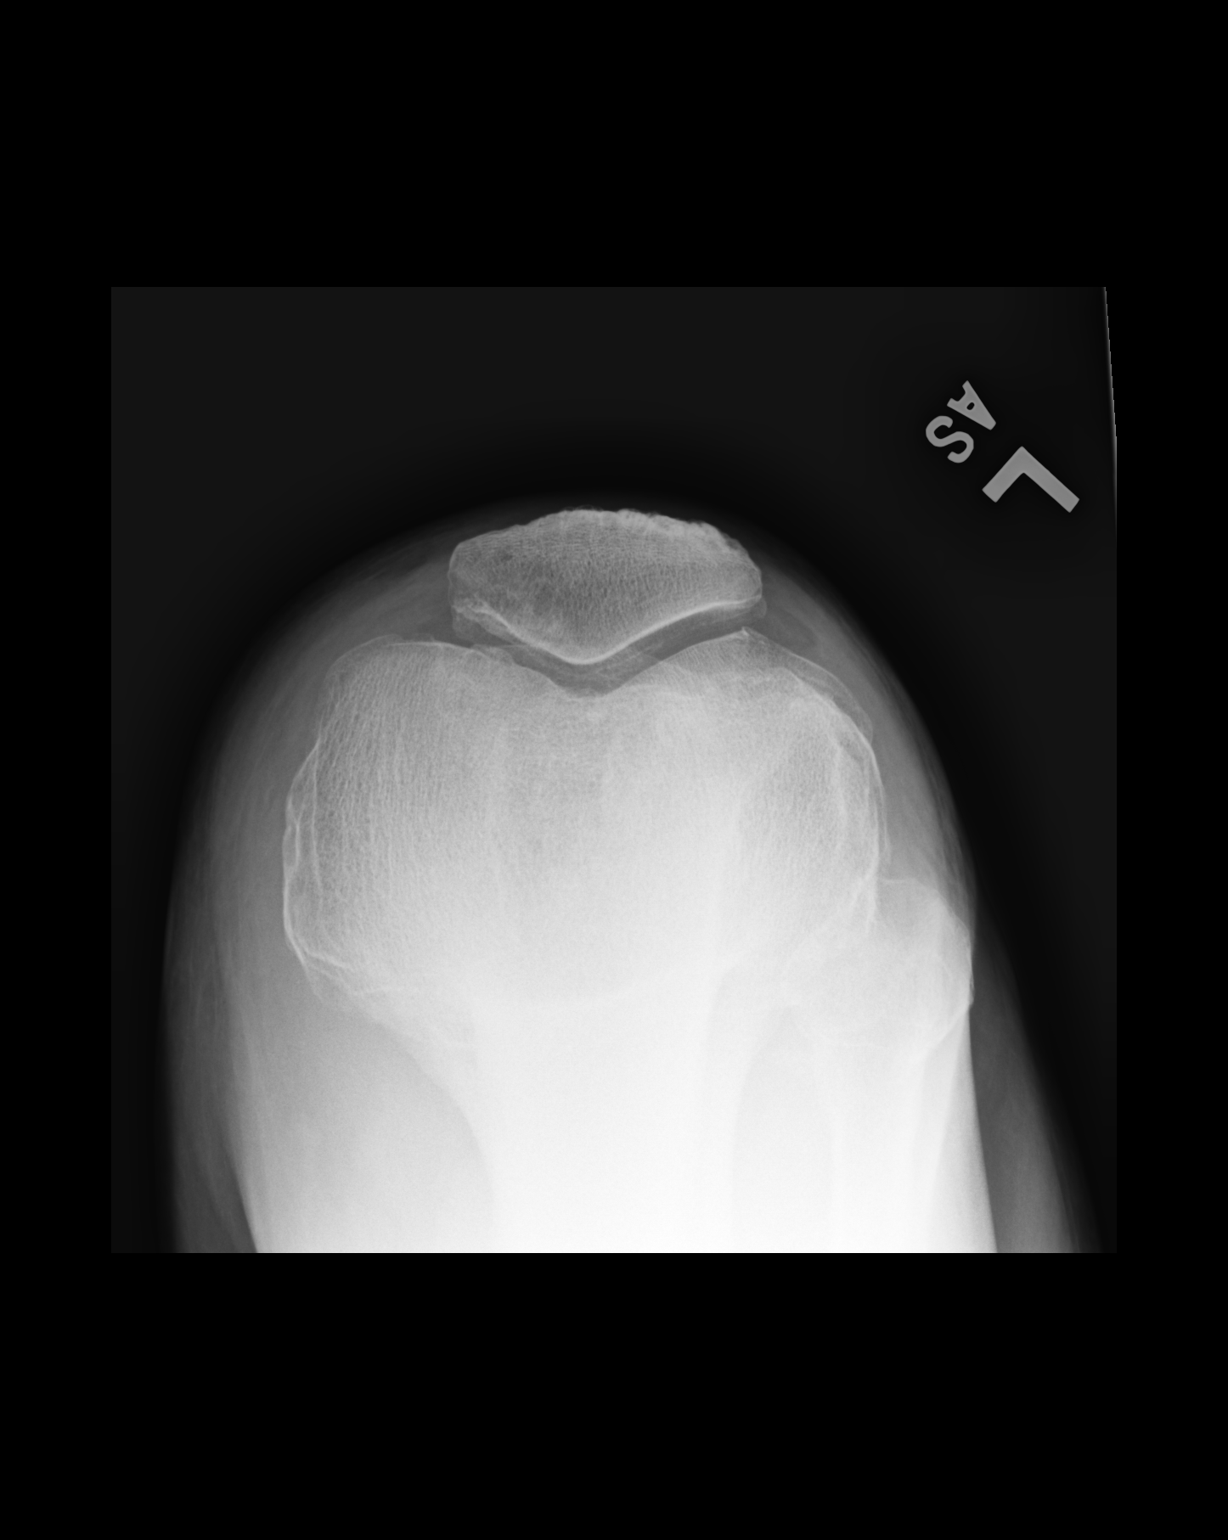

[3 of 3 positions shown; findings below may reference images not displayed]

FINDINGS: No acute fracture. There is mild deformity of the proximal tibia
consistent with an old, healed fracture. No bone lesion.

Mild narrowing of the medial joint space compartment. There are
small marginal osteophytes from all 3 compartments. No other
arthropathic change.

Small joint effusion distends the suprapatellar joint capsule.

There are scattered posterior arterial vascular calcifications. Soft
tissues are otherwise unremarkable.
IMPRESSION: 1. No fracture or dislocation.
2. Mild osteoarthritis.
3. Small joint effusion.

## 2018-03-12 DIAGNOSIS — M0579 Rheumatoid arthritis with rheumatoid factor of multiple sites without organ or systems involvement: Secondary | ICD-10-CM | POA: Diagnosis not present

## 2018-04-21 DIAGNOSIS — Z23 Encounter for immunization: Secondary | ICD-10-CM | POA: Diagnosis not present

## 2018-05-07 DIAGNOSIS — M0579 Rheumatoid arthritis with rheumatoid factor of multiple sites without organ or systems involvement: Secondary | ICD-10-CM | POA: Diagnosis not present

## 2018-05-12 DIAGNOSIS — Z79899 Other long term (current) drug therapy: Secondary | ICD-10-CM | POA: Diagnosis not present

## 2018-05-12 DIAGNOSIS — E663 Overweight: Secondary | ICD-10-CM | POA: Diagnosis not present

## 2018-05-12 DIAGNOSIS — M0579 Rheumatoid arthritis with rheumatoid factor of multiple sites without organ or systems involvement: Secondary | ICD-10-CM | POA: Diagnosis not present

## 2018-05-12 DIAGNOSIS — Z6828 Body mass index (BMI) 28.0-28.9, adult: Secondary | ICD-10-CM | POA: Diagnosis not present

## 2018-05-12 DIAGNOSIS — M25562 Pain in left knee: Secondary | ICD-10-CM | POA: Diagnosis not present

## 2018-05-28 DIAGNOSIS — K210 Gastro-esophageal reflux disease with esophagitis: Secondary | ICD-10-CM | POA: Diagnosis not present

## 2018-05-28 DIAGNOSIS — Z9889 Other specified postprocedural states: Secondary | ICD-10-CM | POA: Diagnosis not present

## 2018-05-28 DIAGNOSIS — E559 Vitamin D deficiency, unspecified: Secondary | ICD-10-CM | POA: Diagnosis not present

## 2018-05-28 DIAGNOSIS — E785 Hyperlipidemia, unspecified: Secondary | ICD-10-CM | POA: Diagnosis not present

## 2018-05-28 DIAGNOSIS — Z23 Encounter for immunization: Secondary | ICD-10-CM | POA: Diagnosis not present

## 2018-05-28 DIAGNOSIS — G629 Polyneuropathy, unspecified: Secondary | ICD-10-CM | POA: Diagnosis not present

## 2018-05-28 DIAGNOSIS — E1169 Type 2 diabetes mellitus with other specified complication: Secondary | ICD-10-CM | POA: Diagnosis not present

## 2018-05-28 DIAGNOSIS — Z951 Presence of aortocoronary bypass graft: Secondary | ICD-10-CM | POA: Diagnosis not present

## 2018-05-28 DIAGNOSIS — I63132 Cerebral infarction due to embolism of left carotid artery: Secondary | ICD-10-CM | POA: Diagnosis not present

## 2018-05-28 DIAGNOSIS — Z79899 Other long term (current) drug therapy: Secondary | ICD-10-CM | POA: Diagnosis not present

## 2018-05-28 DIAGNOSIS — M069 Rheumatoid arthritis, unspecified: Secondary | ICD-10-CM | POA: Diagnosis not present

## 2018-05-28 DIAGNOSIS — I10 Essential (primary) hypertension: Secondary | ICD-10-CM | POA: Diagnosis not present

## 2018-05-29 DIAGNOSIS — Z23 Encounter for immunization: Secondary | ICD-10-CM | POA: Diagnosis not present

## 2018-06-08 DIAGNOSIS — I739 Peripheral vascular disease, unspecified: Secondary | ICD-10-CM | POA: Diagnosis not present

## 2018-06-08 DIAGNOSIS — I2510 Atherosclerotic heart disease of native coronary artery without angina pectoris: Secondary | ICD-10-CM | POA: Diagnosis not present

## 2018-06-08 DIAGNOSIS — Z87891 Personal history of nicotine dependence: Secondary | ICD-10-CM | POA: Diagnosis not present

## 2018-06-08 DIAGNOSIS — I10 Essential (primary) hypertension: Secondary | ICD-10-CM | POA: Diagnosis not present

## 2018-06-16 DIAGNOSIS — Z951 Presence of aortocoronary bypass graft: Secondary | ICD-10-CM | POA: Diagnosis not present

## 2018-06-16 DIAGNOSIS — I10 Essential (primary) hypertension: Secondary | ICD-10-CM | POA: Diagnosis not present

## 2018-06-16 DIAGNOSIS — E7800 Pure hypercholesterolemia, unspecified: Secondary | ICD-10-CM | POA: Diagnosis not present

## 2018-06-16 DIAGNOSIS — I639 Cerebral infarction, unspecified: Secondary | ICD-10-CM | POA: Diagnosis not present

## 2018-06-17 DIAGNOSIS — I739 Peripheral vascular disease, unspecified: Secondary | ICD-10-CM | POA: Diagnosis not present

## 2018-07-02 DIAGNOSIS — M0579 Rheumatoid arthritis with rheumatoid factor of multiple sites without organ or systems involvement: Secondary | ICD-10-CM | POA: Diagnosis not present

## 2018-07-02 DIAGNOSIS — Z79899 Other long term (current) drug therapy: Secondary | ICD-10-CM | POA: Diagnosis not present

## 2018-08-21 DIAGNOSIS — E559 Vitamin D deficiency, unspecified: Secondary | ICD-10-CM | POA: Diagnosis not present

## 2018-08-21 DIAGNOSIS — Z79899 Other long term (current) drug therapy: Secondary | ICD-10-CM | POA: Diagnosis not present

## 2018-08-27 DIAGNOSIS — Z79899 Other long term (current) drug therapy: Secondary | ICD-10-CM | POA: Diagnosis not present

## 2018-08-27 DIAGNOSIS — M0579 Rheumatoid arthritis with rheumatoid factor of multiple sites without organ or systems involvement: Secondary | ICD-10-CM | POA: Diagnosis not present

## 2018-09-02 DIAGNOSIS — E559 Vitamin D deficiency, unspecified: Secondary | ICD-10-CM | POA: Diagnosis not present

## 2018-09-02 DIAGNOSIS — Z0000 Encounter for general adult medical examination without abnormal findings: Secondary | ICD-10-CM | POA: Diagnosis not present

## 2018-09-02 DIAGNOSIS — Z79899 Other long term (current) drug therapy: Secondary | ICD-10-CM | POA: Diagnosis not present

## 2018-09-02 DIAGNOSIS — M0579 Rheumatoid arthritis with rheumatoid factor of multiple sites without organ or systems involvement: Secondary | ICD-10-CM | POA: Diagnosis not present

## 2018-09-08 ENCOUNTER — Encounter (HOSPITAL_COMMUNITY): Payer: Medicare Other

## 2018-09-08 ENCOUNTER — Ambulatory Visit: Payer: Medicare Other | Admitting: Family

## 2018-10-22 DIAGNOSIS — M0579 Rheumatoid arthritis with rheumatoid factor of multiple sites without organ or systems involvement: Secondary | ICD-10-CM | POA: Diagnosis not present

## 2018-10-22 DIAGNOSIS — Z79899 Other long term (current) drug therapy: Secondary | ICD-10-CM | POA: Diagnosis not present

## 2018-12-09 DIAGNOSIS — L570 Actinic keratosis: Secondary | ICD-10-CM | POA: Diagnosis not present

## 2018-12-09 DIAGNOSIS — D485 Neoplasm of uncertain behavior of skin: Secondary | ICD-10-CM | POA: Diagnosis not present

## 2018-12-09 DIAGNOSIS — L821 Other seborrheic keratosis: Secondary | ICD-10-CM | POA: Diagnosis not present

## 2018-12-09 DIAGNOSIS — L718 Other rosacea: Secondary | ICD-10-CM | POA: Diagnosis not present

## 2018-12-09 DIAGNOSIS — C4441 Basal cell carcinoma of skin of scalp and neck: Secondary | ICD-10-CM | POA: Diagnosis not present

## 2018-12-09 DIAGNOSIS — L814 Other melanin hyperpigmentation: Secondary | ICD-10-CM | POA: Diagnosis not present

## 2018-12-17 DIAGNOSIS — M0579 Rheumatoid arthritis with rheumatoid factor of multiple sites without organ or systems involvement: Secondary | ICD-10-CM | POA: Diagnosis not present

## 2018-12-17 DIAGNOSIS — Z79899 Other long term (current) drug therapy: Secondary | ICD-10-CM | POA: Diagnosis not present

## 2018-12-18 DIAGNOSIS — Z79899 Other long term (current) drug therapy: Secondary | ICD-10-CM | POA: Diagnosis not present

## 2018-12-18 DIAGNOSIS — M25562 Pain in left knee: Secondary | ICD-10-CM | POA: Diagnosis not present

## 2018-12-18 DIAGNOSIS — M0579 Rheumatoid arthritis with rheumatoid factor of multiple sites without organ or systems involvement: Secondary | ICD-10-CM | POA: Diagnosis not present

## 2019-01-05 DIAGNOSIS — C4441 Basal cell carcinoma of skin of scalp and neck: Secondary | ICD-10-CM | POA: Diagnosis not present

## 2019-01-05 DIAGNOSIS — L905 Scar conditions and fibrosis of skin: Secondary | ICD-10-CM | POA: Diagnosis not present

## 2019-02-11 DIAGNOSIS — Z79899 Other long term (current) drug therapy: Secondary | ICD-10-CM | POA: Diagnosis not present

## 2019-02-11 DIAGNOSIS — M0579 Rheumatoid arthritis with rheumatoid factor of multiple sites without organ or systems involvement: Secondary | ICD-10-CM | POA: Diagnosis not present

## 2019-04-08 DIAGNOSIS — M0579 Rheumatoid arthritis with rheumatoid factor of multiple sites without organ or systems involvement: Secondary | ICD-10-CM | POA: Diagnosis not present

## 2019-04-16 DIAGNOSIS — Z23 Encounter for immunization: Secondary | ICD-10-CM | POA: Diagnosis not present

## 2019-06-03 DIAGNOSIS — M0579 Rheumatoid arthritis with rheumatoid factor of multiple sites without organ or systems involvement: Secondary | ICD-10-CM | POA: Diagnosis not present

## 2019-06-03 DIAGNOSIS — Z79899 Other long term (current) drug therapy: Secondary | ICD-10-CM | POA: Diagnosis not present

## 2019-06-16 DIAGNOSIS — Z85828 Personal history of other malignant neoplasm of skin: Secondary | ICD-10-CM | POA: Diagnosis not present

## 2019-06-16 DIAGNOSIS — L570 Actinic keratosis: Secondary | ICD-10-CM | POA: Diagnosis not present

## 2019-06-16 DIAGNOSIS — L814 Other melanin hyperpigmentation: Secondary | ICD-10-CM | POA: Diagnosis not present

## 2019-06-16 DIAGNOSIS — Z08 Encounter for follow-up examination after completed treatment for malignant neoplasm: Secondary | ICD-10-CM | POA: Diagnosis not present

## 2019-06-16 DIAGNOSIS — L821 Other seborrheic keratosis: Secondary | ICD-10-CM | POA: Diagnosis not present

## 2019-06-17 DIAGNOSIS — I739 Peripheral vascular disease, unspecified: Secondary | ICD-10-CM | POA: Diagnosis not present

## 2019-06-17 DIAGNOSIS — I10 Essential (primary) hypertension: Secondary | ICD-10-CM | POA: Diagnosis not present

## 2019-06-17 DIAGNOSIS — I63132 Cerebral infarction due to embolism of left carotid artery: Secondary | ICD-10-CM | POA: Diagnosis not present

## 2019-06-17 DIAGNOSIS — Z6828 Body mass index (BMI) 28.0-28.9, adult: Secondary | ICD-10-CM | POA: Diagnosis not present

## 2019-06-17 DIAGNOSIS — E785 Hyperlipidemia, unspecified: Secondary | ICD-10-CM | POA: Diagnosis not present

## 2019-06-17 DIAGNOSIS — I2510 Atherosclerotic heart disease of native coronary artery without angina pectoris: Secondary | ICD-10-CM | POA: Diagnosis not present

## 2019-06-17 DIAGNOSIS — M069 Rheumatoid arthritis, unspecified: Secondary | ICD-10-CM | POA: Diagnosis not present

## 2019-06-17 DIAGNOSIS — E1169 Type 2 diabetes mellitus with other specified complication: Secondary | ICD-10-CM | POA: Diagnosis not present

## 2019-06-28 DIAGNOSIS — I10 Essential (primary) hypertension: Secondary | ICD-10-CM | POA: Diagnosis not present

## 2019-06-28 DIAGNOSIS — Z79899 Other long term (current) drug therapy: Secondary | ICD-10-CM | POA: Diagnosis not present

## 2019-06-28 DIAGNOSIS — E785 Hyperlipidemia, unspecified: Secondary | ICD-10-CM | POA: Diagnosis not present

## 2019-06-28 DIAGNOSIS — B353 Tinea pedis: Secondary | ICD-10-CM | POA: Diagnosis not present

## 2019-06-28 DIAGNOSIS — M069 Rheumatoid arthritis, unspecified: Secondary | ICD-10-CM | POA: Diagnosis not present

## 2019-06-28 DIAGNOSIS — Z125 Encounter for screening for malignant neoplasm of prostate: Secondary | ICD-10-CM | POA: Diagnosis not present

## 2019-06-28 DIAGNOSIS — Z0000 Encounter for general adult medical examination without abnormal findings: Secondary | ICD-10-CM | POA: Diagnosis not present

## 2019-06-28 DIAGNOSIS — K2100 Gastro-esophageal reflux disease with esophagitis, without bleeding: Secondary | ICD-10-CM | POA: Diagnosis not present

## 2019-06-28 DIAGNOSIS — E1169 Type 2 diabetes mellitus with other specified complication: Secondary | ICD-10-CM | POA: Diagnosis not present

## 2019-06-28 DIAGNOSIS — E559 Vitamin D deficiency, unspecified: Secondary | ICD-10-CM | POA: Diagnosis not present

## 2019-06-29 DIAGNOSIS — E119 Type 2 diabetes mellitus without complications: Secondary | ICD-10-CM | POA: Diagnosis not present

## 2019-08-05 DIAGNOSIS — M0579 Rheumatoid arthritis with rheumatoid factor of multiple sites without organ or systems involvement: Secondary | ICD-10-CM | POA: Diagnosis not present

## 2019-08-05 DIAGNOSIS — Z79899 Other long term (current) drug therapy: Secondary | ICD-10-CM | POA: Diagnosis not present

## 2019-08-18 DIAGNOSIS — E785 Hyperlipidemia, unspecified: Secondary | ICD-10-CM | POA: Diagnosis not present

## 2019-08-18 DIAGNOSIS — E1169 Type 2 diabetes mellitus with other specified complication: Secondary | ICD-10-CM | POA: Diagnosis not present

## 2019-08-18 DIAGNOSIS — E1165 Type 2 diabetes mellitus with hyperglycemia: Secondary | ICD-10-CM | POA: Diagnosis not present

## 2019-08-19 DIAGNOSIS — Z79899 Other long term (current) drug therapy: Secondary | ICD-10-CM | POA: Diagnosis not present

## 2019-08-20 DIAGNOSIS — Z23 Encounter for immunization: Secondary | ICD-10-CM | POA: Diagnosis not present

## 2019-09-01 DIAGNOSIS — H5203 Hypermetropia, bilateral: Secondary | ICD-10-CM | POA: Diagnosis not present

## 2019-09-01 DIAGNOSIS — E113292 Type 2 diabetes mellitus with mild nonproliferative diabetic retinopathy without macular edema, left eye: Secondary | ICD-10-CM | POA: Diagnosis not present

## 2019-09-01 DIAGNOSIS — Z7984 Long term (current) use of oral hypoglycemic drugs: Secondary | ICD-10-CM | POA: Diagnosis not present

## 2019-09-10 DIAGNOSIS — Z23 Encounter for immunization: Secondary | ICD-10-CM | POA: Diagnosis not present

## 2019-09-30 DIAGNOSIS — Z79899 Other long term (current) drug therapy: Secondary | ICD-10-CM | POA: Diagnosis not present

## 2019-09-30 DIAGNOSIS — M0579 Rheumatoid arthritis with rheumatoid factor of multiple sites without organ or systems involvement: Secondary | ICD-10-CM | POA: Diagnosis not present

## 2019-11-18 DIAGNOSIS — E1165 Type 2 diabetes mellitus with hyperglycemia: Secondary | ICD-10-CM | POA: Diagnosis not present

## 2019-11-25 DIAGNOSIS — Z79899 Other long term (current) drug therapy: Secondary | ICD-10-CM | POA: Diagnosis not present

## 2019-11-25 DIAGNOSIS — M0579 Rheumatoid arthritis with rheumatoid factor of multiple sites without organ or systems involvement: Secondary | ICD-10-CM | POA: Diagnosis not present

## 2019-12-09 DIAGNOSIS — L814 Other melanin hyperpigmentation: Secondary | ICD-10-CM | POA: Diagnosis not present

## 2019-12-09 DIAGNOSIS — Z85828 Personal history of other malignant neoplasm of skin: Secondary | ICD-10-CM | POA: Diagnosis not present

## 2019-12-09 DIAGNOSIS — L821 Other seborrheic keratosis: Secondary | ICD-10-CM | POA: Diagnosis not present

## 2019-12-09 DIAGNOSIS — L570 Actinic keratosis: Secondary | ICD-10-CM | POA: Diagnosis not present

## 2019-12-09 DIAGNOSIS — Z08 Encounter for follow-up examination after completed treatment for malignant neoplasm: Secondary | ICD-10-CM | POA: Diagnosis not present

## 2020-01-17 DIAGNOSIS — Z79899 Other long term (current) drug therapy: Secondary | ICD-10-CM | POA: Diagnosis not present

## 2020-01-17 DIAGNOSIS — E559 Vitamin D deficiency, unspecified: Secondary | ICD-10-CM | POA: Diagnosis not present

## 2020-01-17 DIAGNOSIS — M069 Rheumatoid arthritis, unspecified: Secondary | ICD-10-CM | POA: Diagnosis not present

## 2020-01-17 DIAGNOSIS — R54 Age-related physical debility: Secondary | ICD-10-CM | POA: Diagnosis not present

## 2020-03-08 DIAGNOSIS — Z20822 Contact with and (suspected) exposure to covid-19: Secondary | ICD-10-CM | POA: Diagnosis not present

## 2020-03-15 DIAGNOSIS — M069 Rheumatoid arthritis, unspecified: Secondary | ICD-10-CM | POA: Diagnosis not present

## 2020-03-15 DIAGNOSIS — E1169 Type 2 diabetes mellitus with other specified complication: Secondary | ICD-10-CM | POA: Diagnosis not present

## 2020-03-15 DIAGNOSIS — K4090 Unilateral inguinal hernia, without obstruction or gangrene, not specified as recurrent: Secondary | ICD-10-CM | POA: Diagnosis not present

## 2020-03-15 DIAGNOSIS — Z79899 Other long term (current) drug therapy: Secondary | ICD-10-CM | POA: Diagnosis not present

## 2020-03-15 DIAGNOSIS — E559 Vitamin D deficiency, unspecified: Secondary | ICD-10-CM | POA: Diagnosis not present

## 2020-03-15 DIAGNOSIS — I10 Essential (primary) hypertension: Secondary | ICD-10-CM | POA: Diagnosis not present

## 2020-03-15 DIAGNOSIS — E785 Hyperlipidemia, unspecified: Secondary | ICD-10-CM | POA: Diagnosis not present

## 2020-03-15 DIAGNOSIS — K2100 Gastro-esophageal reflux disease with esophagitis, without bleeding: Secondary | ICD-10-CM | POA: Diagnosis not present

## 2020-03-16 DIAGNOSIS — R54 Age-related physical debility: Secondary | ICD-10-CM | POA: Diagnosis not present

## 2020-03-16 DIAGNOSIS — M069 Rheumatoid arthritis, unspecified: Secondary | ICD-10-CM | POA: Diagnosis not present

## 2020-03-20 DIAGNOSIS — M069 Rheumatoid arthritis, unspecified: Secondary | ICD-10-CM | POA: Diagnosis not present

## 2020-03-20 DIAGNOSIS — R5383 Other fatigue: Secondary | ICD-10-CM | POA: Diagnosis not present

## 2020-03-20 DIAGNOSIS — M0560 Rheumatoid arthritis of unspecified site with involvement of other organs and systems: Secondary | ICD-10-CM | POA: Diagnosis not present

## 2020-03-20 DIAGNOSIS — Z79899 Other long term (current) drug therapy: Secondary | ICD-10-CM | POA: Diagnosis not present

## 2020-03-21 DIAGNOSIS — E559 Vitamin D deficiency, unspecified: Secondary | ICD-10-CM | POA: Diagnosis not present

## 2020-03-21 DIAGNOSIS — I10 Essential (primary) hypertension: Secondary | ICD-10-CM | POA: Diagnosis not present

## 2020-03-21 DIAGNOSIS — E039 Hypothyroidism, unspecified: Secondary | ICD-10-CM | POA: Diagnosis not present

## 2020-03-21 DIAGNOSIS — E119 Type 2 diabetes mellitus without complications: Secondary | ICD-10-CM | POA: Diagnosis not present

## 2020-03-21 DIAGNOSIS — Z125 Encounter for screening for malignant neoplasm of prostate: Secondary | ICD-10-CM | POA: Diagnosis not present

## 2020-03-21 DIAGNOSIS — E782 Mixed hyperlipidemia: Secondary | ICD-10-CM | POA: Diagnosis not present

## 2020-03-22 DIAGNOSIS — K4090 Unilateral inguinal hernia, without obstruction or gangrene, not specified as recurrent: Secondary | ICD-10-CM | POA: Diagnosis not present

## 2020-04-21 DIAGNOSIS — I2510 Atherosclerotic heart disease of native coronary artery without angina pectoris: Secondary | ICD-10-CM | POA: Diagnosis not present

## 2020-04-21 DIAGNOSIS — Z955 Presence of coronary angioplasty implant and graft: Secondary | ICD-10-CM | POA: Diagnosis not present

## 2020-04-21 DIAGNOSIS — K2100 Gastro-esophageal reflux disease with esophagitis, without bleeding: Secondary | ICD-10-CM | POA: Diagnosis not present

## 2020-04-21 DIAGNOSIS — Z8673 Personal history of transient ischemic attack (TIA), and cerebral infarction without residual deficits: Secondary | ICD-10-CM | POA: Diagnosis not present

## 2020-04-21 DIAGNOSIS — Z951 Presence of aortocoronary bypass graft: Secondary | ICD-10-CM | POA: Diagnosis not present

## 2020-04-21 DIAGNOSIS — E1159 Type 2 diabetes mellitus with other circulatory complications: Secondary | ICD-10-CM | POA: Diagnosis not present

## 2020-04-21 DIAGNOSIS — I10 Essential (primary) hypertension: Secondary | ICD-10-CM | POA: Diagnosis not present

## 2020-04-21 DIAGNOSIS — K4090 Unilateral inguinal hernia, without obstruction or gangrene, not specified as recurrent: Secondary | ICD-10-CM | POA: Diagnosis not present

## 2020-04-21 DIAGNOSIS — K4030 Unilateral inguinal hernia, with obstruction, without gangrene, not specified as recurrent: Secondary | ICD-10-CM | POA: Diagnosis not present

## 2020-04-21 DIAGNOSIS — M1990 Unspecified osteoarthritis, unspecified site: Secondary | ICD-10-CM | POA: Diagnosis not present

## 2020-04-21 DIAGNOSIS — E1140 Type 2 diabetes mellitus with diabetic neuropathy, unspecified: Secondary | ICD-10-CM | POA: Diagnosis not present

## 2020-04-21 DIAGNOSIS — Z87891 Personal history of nicotine dependence: Secondary | ICD-10-CM | POA: Diagnosis not present

## 2020-04-21 DIAGNOSIS — E1151 Type 2 diabetes mellitus with diabetic peripheral angiopathy without gangrene: Secondary | ICD-10-CM | POA: Diagnosis not present

## 2020-04-21 DIAGNOSIS — M069 Rheumatoid arthritis, unspecified: Secondary | ICD-10-CM | POA: Diagnosis not present

## 2020-04-21 DIAGNOSIS — E119 Type 2 diabetes mellitus without complications: Secondary | ICD-10-CM | POA: Diagnosis not present

## 2020-05-03 DIAGNOSIS — Z9889 Other specified postprocedural states: Secondary | ICD-10-CM | POA: Diagnosis not present

## 2020-05-04 DIAGNOSIS — Z23 Encounter for immunization: Secondary | ICD-10-CM | POA: Diagnosis not present

## 2020-05-15 DIAGNOSIS — Z23 Encounter for immunization: Secondary | ICD-10-CM | POA: Diagnosis not present

## 2020-05-15 DIAGNOSIS — M069 Rheumatoid arthritis, unspecified: Secondary | ICD-10-CM | POA: Diagnosis not present

## 2020-06-01 DIAGNOSIS — L821 Other seborrheic keratosis: Secondary | ICD-10-CM | POA: Diagnosis not present

## 2020-06-01 DIAGNOSIS — L814 Other melanin hyperpigmentation: Secondary | ICD-10-CM | POA: Diagnosis not present

## 2020-06-01 DIAGNOSIS — L853 Xerosis cutis: Secondary | ICD-10-CM | POA: Diagnosis not present

## 2020-06-01 DIAGNOSIS — L570 Actinic keratosis: Secondary | ICD-10-CM | POA: Diagnosis not present

## 2020-06-01 DIAGNOSIS — Z85828 Personal history of other malignant neoplasm of skin: Secondary | ICD-10-CM | POA: Diagnosis not present

## 2020-06-01 DIAGNOSIS — Z08 Encounter for follow-up examination after completed treatment for malignant neoplasm: Secondary | ICD-10-CM | POA: Diagnosis not present

## 2020-06-01 DIAGNOSIS — D225 Melanocytic nevi of trunk: Secondary | ICD-10-CM | POA: Diagnosis not present

## 2020-06-15 DIAGNOSIS — R54 Age-related physical debility: Secondary | ICD-10-CM | POA: Diagnosis not present

## 2020-06-15 DIAGNOSIS — Z79899 Other long term (current) drug therapy: Secondary | ICD-10-CM | POA: Diagnosis not present

## 2020-06-15 DIAGNOSIS — M0560 Rheumatoid arthritis of unspecified site with involvement of other organs and systems: Secondary | ICD-10-CM | POA: Diagnosis not present

## 2020-06-15 DIAGNOSIS — E119 Type 2 diabetes mellitus without complications: Secondary | ICD-10-CM | POA: Diagnosis not present

## 2020-06-28 DIAGNOSIS — K4090 Unilateral inguinal hernia, without obstruction or gangrene, not specified as recurrent: Secondary | ICD-10-CM | POA: Diagnosis not present

## 2020-06-28 DIAGNOSIS — R1031 Right lower quadrant pain: Secondary | ICD-10-CM | POA: Diagnosis not present

## 2020-06-30 DIAGNOSIS — K4090 Unilateral inguinal hernia, without obstruction or gangrene, not specified as recurrent: Secondary | ICD-10-CM | POA: Diagnosis not present

## 2020-06-30 DIAGNOSIS — R1031 Right lower quadrant pain: Secondary | ICD-10-CM | POA: Diagnosis not present

## 2020-07-10 DIAGNOSIS — M069 Rheumatoid arthritis, unspecified: Secondary | ICD-10-CM | POA: Diagnosis not present

## 2024-02-13 ENCOUNTER — Encounter: Payer: Self-pay | Admitting: Advanced Practice Midwife
